# Patient Record
Sex: Female | Born: 1935 | Race: White | Hispanic: No | Smoking: Never smoker
Health system: Southern US, Community
[De-identification: ages and names within clinical notes are randomized; demographics above are authoritative.]

## PROBLEM LIST (undated history)

## (undated) DIAGNOSIS — L853 Xerosis cutis: Secondary | ICD-10-CM

## (undated) DIAGNOSIS — M810 Age-related osteoporosis without current pathological fracture: Secondary | ICD-10-CM

## (undated) DIAGNOSIS — I509 Heart failure, unspecified: Secondary | ICD-10-CM

## (undated) DIAGNOSIS — K5900 Constipation, unspecified: Secondary | ICD-10-CM

## (undated) DIAGNOSIS — E222 Syndrome of inappropriate secretion of antidiuretic hormone: Secondary | ICD-10-CM

## (undated) DIAGNOSIS — I4891 Unspecified atrial fibrillation: Secondary | ICD-10-CM

## (undated) HISTORY — DX: Heart failure, unspecified: I50.9

## (undated) HISTORY — DX: Age-related osteoporosis without current pathological fracture: M81.0

## (undated) HISTORY — DX: Constipation, unspecified: K59.00

## (undated) HISTORY — DX: Syndrome of inappropriate secretion of antidiuretic hormone: E22.2

## (undated) HISTORY — DX: Xerosis cutis: L85.3

---

## 2014-11-03 DIAGNOSIS — I10 Essential (primary) hypertension: Secondary | ICD-10-CM | POA: Diagnosis present

## 2019-06-07 ENCOUNTER — Other Ambulatory Visit: Payer: Self-pay

## 2019-06-07 ENCOUNTER — Non-Acute Institutional Stay: Payer: Medicare Other | Admitting: Primary Care

## 2019-06-07 DIAGNOSIS — Z515 Encounter for palliative care: Secondary | ICD-10-CM

## 2019-06-07 NOTE — Progress Notes (Signed)
[facility] Pall[facility] Care Consult Note Telephone: [phone]  Fax: [phone]  TELEHEALTH VISIT STATEMENT Due to the COVID-19 crisis, this visit was done via telemedicine from my office. It was initiated and consented to by this patient and/or family.  PATIENT NAME: Marie Perkins [address] [place] [address] [phone] (home)  DOB: Nov 15, 1926 MRN: [id number]  PRIMARY CARE PROVIDER:   Marisa Hua, MD, [place] [address] [phone]  REFERRING PROVIDER:  Marisa Hua, MD [address] [place],  [place] [address] [phone]  RESPONSIBLE PARTY:   Extended Emergency Contact Information Primary Emergency Contact: Posey, DeWitt Phone: [phone] Relation: Son  ASSESSMENT AND RECOMMENDATIONS:   1. Advance Care Planning/Goals of Care: Goals include to maximize quality of life and symptom management. Son is POA and patient gave permission to phone and discuss visit with him. I called durable POA Lorayne Bender and discussed her care. Son states they have never had an advance directives or living will.  He is durable and not health care power of attorney.There does not appear to be a health care power of attorney but he will address this.[name] would like to do a MOST form now, and I will mail it to him.   2. Symptom Management:   Nutrition: Recommend Oragel for gum pain and dental care for ill fitting dentures. Recommend mechanical soft diet to see if she tolerates it well with mouth pain. Complains about dentures. States sore gums. Needs dentist assessment asap. Would like a mechanical soft until she can get dentures resolved. I discussed dental care with son. They may have used a topical treatment in the past like Oragel. Son thought she may just want to change her diet rather than get new dentures but he will let her decide.  Pain: Recommend scheduled tylenol arthritis bid or  tid, 650 mg. Widespread arthritic pain. States she is not in pain if she sits still.  I discussed pain management with son who states she can ask for norco when she needs. He was able to see on their billing that she has not been taking much since she has not had a refill in 2 months. I would like to start with scheduling tylenol and then assess on next visit if she needs narcotic scheduling. Continue with prn for now.  3. Family /Caregiver/Community Supports:  Son is POA. Patient lives in [place]. Has other children out of the area.Has a disabled son. Husband died recently, and he was her caregiver. She moved to LTC in 2/20.  4. Cognitive / Functional decline: Alert, oriented x 2-3. She is retired Marine scientist. State she can walk with stand by assist and walker. She has call bell and demonstrated its use.  5. Follow up Palliative Care Visit: Palliative care will continue to follow for goals of care clarification and symptom management. Return 5-6 weeks or prn.  I spent 60 minutes providing this consultation,  from 0930 to 1030. More than 50% of the time in this consultation was spent coordinating communication.   HISTORY OF PRESENT ILLNESS:  Marie Perkins is a 85 y.o. year old female with multiple medical problems including arthritis, immobility, chronic pain,h/o CHF, HTN, constipation, spinal stenosis.  Palliative Care was asked to follow this patient by consultation request of Marisa Hua, MD to help address advance care planning and goals of care. This is the initial visit. CODE STATUS: FULL CODE  PPS: 30%  HOSPICE ELIGIBILITY/DIAGNOSIS:  no  PAST MEDICAL HISTORY: CHF, Pain, HTN, OA, constipation, HLD, spinal stenosis, osteoporosis, SIADH SOCIAL HX:  Social History   Tobacco Use  . Smoking status: Not on file  Substance Use Topics  . Alcohol use: Not on file    ALLERGIES:  Allergies  Allergen Reactions  . Crestor [Rosuvastatin Calcium]   . Pravastatin   . Simvastatin     PERTINENT  MEDICATIONS:  Outpatient Encounter Medications as of 06/07/2019  Medication Sig  . acetaminophen (TYLENOL) 650 MG CR tablet Take 650 mg by mouth at bedtime. PRN pain  . benazepril (LOTENSIN) 20 MG tablet Take 20 mg by mouth 2 (two) times daily.  . bisacodyl (DULCOLAX) 5 MG EC tablet Take 5 mg by mouth daily.  . calcium carbonate (OSCAL) 1500 (600 Ca) MG TABS tablet Take by mouth daily. Once a day on MWF  . carvedilol (COREG) 3.125 MG tablet Take 3.125 mg by mouth every 12 (twelve) hours.  . diclofenac Sodium (VOLTAREN) 1 % GEL Apply 2 g topically 4 (four) times daily as needed. As needed to bilateral knees for pain  . furosemide (LASIX) 40 MG tablet Take 40 mg by mouth daily.  . Garlic 10 MG CAPS Take 1 tablet by mouth daily. On Sunday, Tues, Thurs and Saturday.  . Grape Seed 50 MG CAPS Take 1 capsule by mouth daily. On Sunday, Tues, Thurs and Sat.  Marland Kitchen HYDROcodone-acetaminophen (NORCO/VICODIN) 5-325 MG tablet Take 0.5 tablets by mouth every 6 (six) hours as needed. Mild pain  . HYDROcodone-acetaminophen (NORCO/VICODIN) 5-325 MG tablet Take 1 tablet by mouth every 6 (six) hours as needed for moderate pain.  . Lidocaine (ASPERCREME LIDOCAINE) 4 % PTCH Apply 1 patch topically at bedtime. Apply in the pm and remove in the am.  . magnesium hydroxide (MILK OF MAGNESIA) 400 MG/5ML suspension Take 30 mLs by mouth daily as needed for mild constipation.  . Omega-3 Fatty Acids (EQL OMEGA 3 FISH OIL) 1000 MG CAPS Take 1 capsule by mouth every other day.  . polyethylene glycol (MIRALAX / GLYCOLAX) 17 g packet Take 17 g by mouth daily. On Monday, Wednesday and Friday.  . Turmeric (QC TUMERIC COMPLEX) 500 MG CAPS Take 1 capsule by mouth daily.  . Vitamin D, Ergocalciferol, (DRISDOL) 1.25 MG (50000 UT) CAPS capsule Take 50,000 Units by mouth every 14 (fourteen) days. On Tuesday.   No facility-administered encounter medications on file as of 06/07/2019.      PHYSICAL EXAM / ROS:    97.5  68-6 118/62 Current  and past weights: 120-122 lbs. Son states 1 year ago was 130 lbs General: NAD, frail appearing, thin, Endorses general hip pain  HEENT: Endorses oral pain and mal-fitting dentures. Cardiovascular: no chest pain reported, no edema noted Pulmonary: no cough, no increased SOB, no DOE with talking, Room air, oxygen 95% Abdomen: appetite good, denies constipation, h/o constipationendorses loose BM last pm incontinent of bowel GU: denies dysuria, incontinent of urine MSK:  no joint deformities, ambulatory with walker and stand by assistance Skin: no rashes or wounds reported Neurological: Weakness, forgetful, states sleeps well.   Eliezer Lofts, NP [facility]

## 2019-07-11 ENCOUNTER — Other Ambulatory Visit: Payer: Self-pay

## 2019-07-11 ENCOUNTER — Non-Acute Institutional Stay: Payer: Medicare Other | Admitting: Primary Care

## 2019-07-12 ENCOUNTER — Other Ambulatory Visit: Payer: Self-pay

## 2019-07-12 ENCOUNTER — Non-Acute Institutional Stay: Payer: Medicare Other | Admitting: Primary Care

## 2019-07-12 DIAGNOSIS — Z515 Encounter for palliative care: Secondary | ICD-10-CM

## 2019-07-12 NOTE — Progress Notes (Signed)
[facility] Pall[facility] Consult Note Telephone: [phone]  Fax: [phone]  TELEHEALTH VISIT STATEMENT Due to the COVID-19 crisis, this visit was done via telemedicine from my office. It was initiated and consented to by this patient and/or family.  PATIENT NAME: Marie Perkins [address] [place] [address] [phone] (home)  DOB: [birth year] MRN: [id number]  PRIMARY CARE PROVIDER:   Marisa Hua, MD, [place] [address] [phone]  REFERRING PROVIDER:  Marisa Hua, MD [address] [place],  [place] [address] [phone]  RESPONSIBLE PARTY:   Extended Emergency Contact Information Primary Emergency Contact: Marie, Perkins Address: [place]          [place],  [address] Home Phone: [phone] Relation: Son   ASSESSMENT AND RECOMMENDATIONS:   1. Advance Care Planning/Goals of Care: Goals include to maximize quality of life and symptom management.FUll code per SNF. No MOST returned from son as of yet, will f/u with phone conference  2. Symptom Management:   Nutrition: Eating 75% of meals. Weight improving, has gained  A few pounds in past month.Appears to be tolerating diet well.  Pain:  Recommend changing pain medication to Roxanol 5 mg for mild pain, 10 mg for moderate pain every 4 hours prn, and increasing ATC acetaminophen to q 8 hrs. Continues to endorse back pain but appears to be more comfortable with ATC acetaminophen.  Mobility: This may improve with ATC pain control. States she cannot get oob alone but does walk with walker and stand by assistance.   3. Family /Caregiver/Community Supports:  Son is POA. Patient lives in [place]. Has other children out of the area.Has a disabled son. Husband died recently, and he was her caregiver. She moved to LTC in 2/20.  4. Cognitive / Functional decline: Alert, oriented x 2-3. Can ambulate to bathroom with walker and assistance.  Reports some incontinence if she is not able to get to bathroom in time. She states she cannot get out of her chair by herself.  5. Follow up Palliative Care Visit: Palliative care will continue to follow for goals of care clarification and symptom management. Return 4 weeks or prn.  I spent 25 minutes providing this consultation,  from 1400 to 1425. More than 50% of the time in this consultation was spent coordinating communication.   HISTORY OF PRESENT ILLNESS:  Marie Perkins is a 86 y.o. year old female with multiple medical problems including arthritis, immobility, chronic pain,h/o CHF, HTN, constipation, spinal stenosis. . Palliative Care was asked to follow this patient by consultation request of Marisa Hua, MD to help address advance care planning and goals of care. This is a follow up visit.  CODE STATUS: FULL CODE  PPS: 30% HOSPICE ELIGIBILITY/DIAGNOSIS: TBD  PAST MEDICAL HISTORY: CHF, Pain, HTN, OA, constipation, HLD, spinal stenosis, osteoporosis, SIADH  SOCIAL HX:  Social History   Tobacco Use  . Smoking status: Not on file  Substance Use Topics  . Alcohol use: Not on file    ALLERGIES:  Allergies  Allergen Reactions  . Crestor [Rosuvastatin Calcium]   . Pravastatin   . Simvastatin      PERTINENT MEDICATIONS:  Outpatient Encounter Medications as of 07/12/2019  Medication Sig  . acetaminophen (TYLENOL) 650 MG CR tablet Take 650 mg by mouth at bedtime. PRN pain  . benazepril (LOTENSIN) 20 MG tablet Take 20 mg by mouth 2 (two) times daily.  . bisacodyl (DULCOLAX) 5  MG EC tablet Take 5 mg by mouth daily.  . calcium carbonate (OSCAL) 1500 (600 Ca) MG TABS tablet Take by mouth daily. Once a day on MWF  . carvedilol (COREG) 3.125 MG tablet Take 3.125 mg by mouth every 12 (twelve) hours.  . diclofenac Sodium (VOLTAREN) 1 % GEL Apply 2 g topically 4 (four) times daily as needed. As needed to bilateral knees for pain  . furosemide (LASIX) 40 MG tablet Take 40 mg by mouth  daily.  . Garlic 10 MG CAPS Take 1 tablet by mouth daily. On Sunday, Tues, Thurs and Saturday.  . Grape Seed 50 MG CAPS Take 1 capsule by mouth daily. On Sunday, Tues, Thurs and Sat.  Marland Kitchen HYDROcodone-acetaminophen (NORCO/VICODIN) 5-325 MG tablet Take 0.5 tablets by mouth every 6 (six) hours as needed. Mild pain  . HYDROcodone-acetaminophen (NORCO/VICODIN) 5-325 MG tablet Take 1 tablet by mouth every 6 (six) hours as needed for moderate pain.  . Lidocaine (ASPERCREME LIDOCAINE) 4 % PTCH Apply 1 patch topically at bedtime. Apply in the pm and remove in the am.  . magnesium hydroxide (MILK OF MAGNESIA) 400 MG/5ML suspension Take 30 mLs by mouth daily as needed for mild constipation.  . Omega-3 Fatty Acids (EQL OMEGA 3 FISH OIL) 1000 MG CAPS Take 1 capsule by mouth every other day.  . polyethylene glycol (MIRALAX / GLYCOLAX) 17 g packet Take 17 g by mouth daily. On Monday, Wednesday and Friday.  . Turmeric (QC TUMERIC COMPLEX) 500 MG CAPS Take 1 capsule by mouth daily.  . Vitamin D, Ergocalciferol, (DRISDOL) 1.25 MG (50000 UT) CAPS capsule Take 50,000 Units by mouth every 14 (fourteen) days. On Tuesday.   No facility-administered encounter medications on file as of 07/12/2019.    PHYSICAL EXAM / ROS:   Current and past weights: 124 lbs. Appears to be gaining some weight General: NAD, frail appearing, thin, endorse back pain. Cardiovascular: no chest pain reported, no edema Pulmonary: no cough, no increased SOB, room air Abdomen: appetite good , denies constipation, continent of bowel GU: denies dysuria, incontinent of urine if not toileted in time. MSK:  no joint deformities, ambulatory with walker and assistance to prevent falls Skin: no rashes or wounds reported Neurological: Weakness, FAST score 6d, sleeps well  Eliezer Lofts, NP [facility]

## 2019-08-08 ENCOUNTER — Non-Acute Institutional Stay: Payer: Medicare Other | Admitting: Primary Care

## 2019-08-09 ENCOUNTER — Other Ambulatory Visit: Payer: Self-pay

## 2019-08-09 ENCOUNTER — Non-Acute Institutional Stay: Payer: Medicare Other | Admitting: Primary Care

## 2019-08-09 DIAGNOSIS — Z515 Encounter for palliative care: Secondary | ICD-10-CM

## 2019-08-09 NOTE — Progress Notes (Signed)
[facility] Pall[facility] Care Consult Note Telephone: [phone]  Fax: [phone]  TELEHEALTH VISIT STATEMENT Due to the COVID-19 crisis, this visit was done via telemedicine from my office. It was initiated and consented to by this patient and/or family.  PATIENT NAME: Marie Perkins [address] [place] [address] [phone] (home)  DOB: Nov 30, 1926 MRN: [id number]  PRIMARY CARE PROVIDER:   Marisa Hua, MD, [place] [address] [phone]  REFERRING PROVIDER:  Marisa Hua, MD [address] [place],  [place] [address] [phone]  RESPONSIBLE PARTY:   Extended Emergency Contact Information Primary Emergency Contact: Margurite, Duffy Address: [place]          [place],  [address] Home Phone: [phone] Relation: Son  ASSESSMENT AND RECOMMENDATIONS:   1. Advance Care Planning/Goals of Care: Goals include to maximize quality of life and symptom management. POA is son [name]. I called his number to inform of visit but he did not pick up and mailbox was full. Not able to leave message. No immediate concerns.  Patient remains FULL code. I mailed a MOST form to him but it has not been returned there.   2. Symptom Management:   Pain: Recommend scheduling acetaminophen 650 mg q 8 hrs. May need to separate narcotic from acetaminophen preparation to limit too much acetaminophen dosing. Needs more medicine lately, having more pain. Recommend scheduling acetaminophen, assessing for more regular narcotic administration  Constipation: Increased now due to increased pain medications. Please increase Miralax to daily.  Nutrition:  States she's eating well, 75% intake.  Mobility: Had PT this week and helping with walking.  Ear pain/HOH: Needs cerumen removal every 2 months. She states she was ordered by Duaine Dredge MD for this and it has not been done for a years. Recommend NP please remove cerumen  every several months for comfort and for better hearing.   Continence; Has voiced several times she is incontinent because she cannot get to the bathroom in time. I suggested she attempt timed bathroom trips, going before she feels she cannot control her bowels or bladder. Staff could assist with a toileting schedule.  3. Family /Caregiver/Community Supports:  Son lives in [place], cares for special needs brother. Pt lives in [place].  4. Cognitive / Functional decline: Alert, oriented x 3. Needs assistance with most adls, all iadls.  5. Follow up Palliative Care Visit: Palliative care will continue to follow for goals of care clarification and symptom management. Return 4-6 weeks or prn.  I spent 25 minutes providing this consultation,  from 1030 to 1055. More than 50% of the time in this consultation was spent coordinating communication.   HISTORY OF PRESENT ILLNESS:  Marie Perkins is a 86 y.o. year old female with multiple medical problems including arthritis, immobility, chronic pain,h/o CHF, HTN, constipation, spinal stenosis. Palliative Care was asked to follow this patient by consultation request of Marisa Hua, MD to help address advance care planning and goals of care. This is a follow up visit.  CODE STATUS:  FULL CODE  PPS: 30% HOSPICE ELIGIBILITY/DIAGNOSIS: TBD  PAST MEDICAL HISTORY: CHF, Pain, HTN, OA, constipation, HLD, spinal stenosis, osteoporosis, SIADH   SOCIAL HX:  Social History   Tobacco Use  . Smoking status: Not on file  Substance Use Topics  . Alcohol use: Not on file    ALLERGIES:  Allergies  Allergen Reactions  . Crestor [Rosuvastatin Calcium]   . Pravastatin   .  Simvastatin      PERTINENT MEDICATIONS:  Outpatient Encounter Medications as of 08/09/2019  Medication Sig  . acetaminophen (TYLENOL) 650 MG CR tablet Take 650 mg by mouth at bedtime. PRN pain  . benazepril (LOTENSIN) 20 MG tablet Take 20 mg by mouth 2 (two) times daily.  . bisacodyl  (DULCOLAX) 5 MG EC tablet Take 5 mg by mouth daily.  . calcium carbonate (OSCAL) 1500 (600 Ca) MG TABS tablet Take by mouth daily. Once a day on MWF  . carvedilol (COREG) 3.125 MG tablet Take 3.125 mg by mouth every 12 (twelve) hours.  . diclofenac Sodium (VOLTAREN) 1 % GEL Apply 2 g topically 4 (four) times daily as needed. As needed to bilateral knees for pain  . furosemide (LASIX) 40 MG tablet Take 40 mg by mouth daily.  . Garlic 10 MG CAPS Take 1 tablet by mouth daily. On Sunday, Tues, Thurs and Saturday.  . Grape Seed 50 MG CAPS Take 1 capsule by mouth daily. On Sunday, Tues, Thurs and Sat.  Marland Kitchen HYDROcodone-acetaminophen (NORCO/VICODIN) 5-325 MG tablet Take 0.5 tablets by mouth every 6 (six) hours as needed. Mild pain  . HYDROcodone-acetaminophen (NORCO/VICODIN) 5-325 MG tablet Take 1 tablet by mouth every 6 (six) hours as needed for moderate pain.  . Lidocaine (ASPERCREME LIDOCAINE) 4 % PTCH Apply 1 patch topically at bedtime. Apply in the pm and remove in the am.  . magnesium hydroxide (MILK OF MAGNESIA) 400 MG/5ML suspension Take 30 mLs by mouth daily as needed for mild constipation.  . Omega-3 Fatty Acids (EQL OMEGA 3 FISH OIL) 1000 MG CAPS Take 1 capsule by mouth every other day.  . polyethylene glycol (MIRALAX / GLYCOLAX) 17 g packet Take 17 g by mouth daily. On Monday, Wednesday and Friday.  . Turmeric (QC TUMERIC COMPLEX) 500 MG CAPS Take 1 capsule by mouth daily.  . Vitamin D, Ergocalciferol, (DRISDOL) 1.25 MG (50000 UT) CAPS capsule Take 50,000 Units by mouth every 14 (fourteen) days. On Tuesday.   No facility-administered encounter medications on file as of 08/09/2019.    PHYSICAL EXAM / ROS:   Current and past weights: 122.6  lbs General: NAD, frail appearing, thin, ENDORSES pain in back and legs Cardiovascular: no chest pain reported, no edema Pulmonary: no cough, no increased SOB, room air Abdomen: appetite good, endorses constipation, incontinent of bowel if not taken to the  bathroom in time. GU: denies dysuria, incontinent of urine if not taken to bathroom in time. MSK:  no joint deformities,non ambulatory Skin: no rashes or wounds reported Neurological: Weakness, endorses pain, poor sleep  Eliezer Lofts, NP [facility]

## 2019-09-05 ENCOUNTER — Other Ambulatory Visit: Payer: Self-pay

## 2019-09-05 ENCOUNTER — Non-Acute Institutional Stay: Payer: Medicare Other | Admitting: Primary Care

## 2019-09-05 DIAGNOSIS — Z515 Encounter for palliative care: Secondary | ICD-10-CM

## 2019-09-05 NOTE — Progress Notes (Signed)
[facility] Consult Note Telephone: [phone]  Fax: [phone]   PATIENT NAME: Marie Perkins [address] [place] [address] [phone] (home)  DOB: 1926/12/22 MRN: [id number]  PRIMARY CARE PROVIDER:   Marisa Hua, MD, [place] [address] [phone]  REFERRING PROVIDER:  Marisa Hua, MD [address] [place],  [place] [address] [phone]  RESPONSIBLE PARTY:   Extended Emergency Contact Information Primary Emergency Contact: Marie Perkins Address: [address]          [place], [place] [address] Home Phone: [phone] Relation: Son   ASSESSMENT AND RECOMMENDATIONS:   1. Advance Care Planning/Goals of Care: Goals include to maximize quality of life and symptom management.  I met with [name] today in her room. She was alert and endorses some forgetfulness. She is pleasant and denies severe pain. She is comfortable sitting but is painful when she gets up to ambulate.   2. Symptom Management:   Hearing deficit: Has reported in the past. Please order ear cleaning every 2 months. Please order debrox or oil to soften ear wax. Please order audiology f/u. She has not been seen in a year. She goes to [facility]; she is also in need of an eye exam as well.  Pain: Please schedule dicloflonac qid  ATC and acetaminophen 650 mg q 8 hours ATC.  She continues to complain of unresolved pain  Scalp scaling: Patient is concerned about scalp flaking. Please get selsun blue or similar for scalp  Timed toileting: Please take to bathroom on a schedule. She states often she cannot wait when she puts on the call bell.  Has taken Covid 19 immunization.   3. Family /Caregiver/Community Supports: Son is in [place] and is POA. Living in LTC.  4. Cognitive / Functional decline: Alert oriented x 2-3. States some forgetfulness. Needs help / stand by with ambulation, other adls.   5. Follow  up Palliative Care Visit: Palliative care will continue to follow for goals of care clarification and symptom management. Return 2 weeks or prn.  I spent 25 minutes providing this consultation,  from 1200 to 1225. More than 50% of the time in this consultation was spent coordinating communication.   HISTORY OF PRESENT ILLNESS:  Marie Perkins is a 86 y.o. year old female with multiple medical problems including arthritis, immobility, chronic pain,h/o CHF, HTN, constipation, spinal stenosis. Palliative Care was asked to follow this patient by consultation request of Marisa Hua, MD to help address advance care planning and goals of care. This is a follow up visit.  CODE STATUS: FULL CODE PPS: 40% HOSPICE ELIGIBILITY/DIAGNOSIS: TBD  PAST MEDICAL HISTORY: CHF, Pain, HTN, OA, constipation, HLD, spinal stenosis, osteoporosis, SIADH SOCIAL HX:  Social History   Tobacco Use  . Smoking status: Not on file  Substance Use Topics  . Alcohol use: Not on file    ALLERGIES:  Allergies  Allergen Reactions  . Crestor [Rosuvastatin Calcium]   . Pravastatin   . Simvastatin      PERTINENT MEDICATIONS:  Outpatient Encounter Medications as of 09/05/2019  Medication Sig  . acetaminophen (TYLENOL) 650 MG CR tablet Take 650 mg by mouth at bedtime. PRN pain  . benazepril (LOTENSIN) 20 MG tablet Take 20 mg by mouth 2 (two) times daily.  . bisacodyl (DULCOLAX) 5 MG EC tablet Take 5 mg by mouth daily.  . calcium carbonate (OSCAL) 1500 (600 Ca) MG  TABS tablet Take by mouth daily. Once a day on MWF  . carvedilol (COREG) 3.125 MG tablet Take 3.125 mg by mouth every 12 (twelve) hours.  . diclofenac Sodium (VOLTAREN) 1 % GEL Apply 2 g topically 4 (four) times daily as needed. As needed to bilateral knees for pain  . furosemide (LASIX) 40 MG tablet Take 40 mg by mouth daily.  . Garlic 10 MG CAPS Take 1 tablet by mouth daily. On Sunday, Tues, Thurs and Saturday.  . Grape Seed 50 MG CAPS Take 1 capsule by mouth  daily. On Sunday, Tues, Thurs and Sat.  Marland Kitchen HYDROcodone-acetaminophen (NORCO/VICODIN) 5-325 MG tablet Take 0.5 tablets by mouth every 6 (six) hours as needed. Mild pain  . HYDROcodone-acetaminophen (NORCO/VICODIN) 5-325 MG tablet Take 1 tablet by mouth every 6 (six) hours as needed for moderate pain.  . Lidocaine (ASPERCREME LIDOCAINE) 4 % PTCH Apply 1 patch topically at bedtime. Apply in the pm and remove in the am.  . magnesium hydroxide (MILK OF MAGNESIA) 400 MG/5ML suspension Take 30 mLs by mouth daily as needed for mild constipation.  . Omega-3 Fatty Acids (EQL OMEGA 3 FISH OIL) 1000 MG CAPS Take 1 capsule by mouth every other day.  . polyethylene glycol (MIRALAX / GLYCOLAX) 17 g packet Take 17 g by mouth daily. On Monday, Wednesday and Friday.  . Turmeric (QC TUMERIC COMPLEX) 500 MG CAPS Take 1 capsule by mouth daily.  . Vitamin D, Ergocalciferol, (DRISDOL) 1.25 MG (50000 UT) CAPS capsule Take 50,000 Units by mouth every 14 (fourteen) days. On Tuesday.   No facility-administered encounter medications on file as of 09/05/2019.    PHYSICAL EXAM / ROS:   Current and past weights: 124 lbs General: NAD, frail appearing, thin,  HeeNT: endorses ear discomfort , endorses scalp scaling Cardiovascular: no chest pain reported, no LE edema Pulmonary: no cough, no increased SOB, room air Abdomen: appetite good, denies constipation, continent of bowel GU: denies dysuria, incontinent of urine MSK:  no joint deformities, ambulatory with  Walker with stand by assistance Skin: no rashes or wounds reported Neurological: Weakness, memory loss  Jason Coop, NP  [facility]   COVID-19 PATIENT SCREENING TOOL  Person answering questions: __________Staff_________ _____   1.  Is the patient or any family member in the home showing any signs or symptoms regarding respiratory infection?               Person with Symptom- ___________________________  a. Fever                                                                           Yes___ No___          ___________________  b. Shortness of breath                                                    Yes___ No___          ___________________ c. Cough/congestion  Yes___  No___         ___________________ d. Body aches/pains                                                         Yes___ No___        ____________________ e. Gastrointestinal symptoms (diarrhea, nausea)           Yes___ No___        ____________________  2. Within the past 14 days, has anyone living in the home had any contact with someone with or under investigation for COVID-19?    Yes___ No_x_   Person __________________

## 2019-09-20 ENCOUNTER — Non-Acute Institutional Stay: Payer: Medicare Other | Admitting: Primary Care

## 2019-09-20 ENCOUNTER — Other Ambulatory Visit: Payer: Self-pay

## 2019-09-20 DIAGNOSIS — Z515 Encounter for palliative care: Secondary | ICD-10-CM

## 2019-09-20 NOTE — Progress Notes (Signed)
[facility] Consult Note Telephone: [phone]  Fax: [phone]    PATIENT NAME: Marie Perkins [address] [place] [address] [phone] (home)  DOB: Feb 22, 1927 MRN: [id number]  PRIMARY CARE PROVIDER:   Marisa Hua, MD, [place] [address] [phone]  REFERRING PROVIDER:  Marisa Hua, MD [address] [place],  [place] [address] [phone]  RESPONSIBLE PARTY:   Extended Emergency Contact Information Primary Emergency Contact: Agueda, Houpt Address: [address]          [place], [place] [address] Home Phone: [phone] Relation: Son  I met with Mrs. Mongeau in her room.  ASSESSMENT AND RECOMMENDATIONS:   1. Advance Care Planning/Goals of Care: Goals include to maximize quality of life and symptom management. Patient has a full code directive. I will ask her on her next visit about these directives. She does not have a Living will on file and a most form mailed to her son has not been returned. Once I have a discussion with her I will follow up with any directive she would like to make.  2. Symptom Management:   Constipation patient endorsed ongoing constipation. She states she goes every day or every other day but has a hard stool that requires straining. Currently her MiraLAX was listed as three times a week. I recommended and left an order to increase that to daily. She's also on some other preparation. The goal would be to titrate to a formed soft stool every other day. I will continue to monitor.   Intake. Patient is eating for 75 to 100% of meals. Her weight is maintaining stable. She has some snacks in her room and endorses a good appetite.   Impacted cerumen This has been a reported issue and she was seen every 2 month for this before covid. Today I brought my otoscope and examined her ears and attempted irrigation. Her right ear has partial obstruction of cerumen but  tympanum can be visualized. The left however has completely impacted cerumen. Nothing irrigated so I left an order for Debrox in both ears. This will be given times five days and I will return on Wednesday to disimpact the cerumen.  3. Family /Caregiver/Community Supports: POA Son lives in [place], has other children in area. Current resident of LTC.  4. Cognitive / Functional decline: Alert and oriented x 3, forgetful. Able to ambulate to bathroom with help. Needs assistance with most adls, all iadls.   5. Follow up Palliative Care Visit: Palliative care will continue to follow for goals of care clarification and symptom management. Return 1 week or prn.  I spent 35 minutes providing this consultation,  from 1100 to 1135. More than 50% of the time in this consultation was spent coordinating communication.   HISTORY OF PRESENT ILLNESS:  Marie Perkins is a 86 y.o. year old female with multiple medical problems including Hearing loss, impacted cerumen, constipation, cognitive impairment. Palliative Care was asked to follow this patient by consultation request of Marisa Hua, MD to help address advance care planning and goals of care. This is a follow up visit.  CODE STATUS: FULL CODE  PPS: 30% HOSPICE ELIGIBILITY/DIAGNOSIS: TBD  PAST MEDICAL HISTORY: No past medical history on file.  SOCIAL HX:  Social History   Tobacco Use  . Smoking status: Not on file  Substance Use Topics  . Alcohol use: Not on file    ALLERGIES:  Allergies  Allergen Reactions  . Crestor [Rosuvastatin Calcium]   . Pravastatin   . Simvastatin      PERTINENT MEDICATIONS:  Outpatient Encounter Medications as of 09/20/2019  Medication Sig  . acetaminophen (TYLENOL) 650 MG CR tablet Take 650 mg by mouth at bedtime. PRN pain  . benazepril (LOTENSIN) 20 MG tablet Take 20 mg by mouth 2 (two) times daily.  . bisacodyl (DULCOLAX) 5 MG EC tablet Take 5 mg by mouth daily.  . calcium carbonate (OSCAL) 1500 (600 Ca) MG  TABS tablet Take by mouth daily. Once a day on MWF  . carvedilol (COREG) 3.125 MG tablet Take 3.125 mg by mouth every 12 (twelve) hours.  . diclofenac Sodium (VOLTAREN) 1 % GEL Apply 2 g topically 4 (four) times daily as needed. As needed to bilateral knees for pain  . furosemide (LASIX) 40 MG tablet Take 40 mg by mouth daily.  . Garlic 10 MG CAPS Take 1 tablet by mouth daily. On Sunday, Tues, Thurs and Saturday.  . Grape Seed 50 MG CAPS Take 1 capsule by mouth daily. On Sunday, Tues, Thurs and Sat.  Marland Kitchen HYDROcodone-acetaminophen (NORCO/VICODIN) 5-325 MG tablet Take 0.5 tablets by mouth every 6 (six) hours as needed. Mild pain  . HYDROcodone-acetaminophen (NORCO/VICODIN) 5-325 MG tablet Take 1 tablet by mouth every 6 (six) hours as needed for moderate pain.  . Lidocaine (ASPERCREME LIDOCAINE) 4 % PTCH Apply 1 patch topically at bedtime. Apply in the pm and remove in the am.  . magnesium hydroxide (MILK OF MAGNESIA) 400 MG/5ML suspension Take 30 mLs by mouth daily as needed for mild constipation.  . Omega-3 Fatty Acids (EQL OMEGA 3 FISH OIL) 1000 MG CAPS Take 1 capsule by mouth every other day.  . polyethylene glycol (MIRALAX / GLYCOLAX) 17 g packet Take 17 g by mouth daily. On Monday, Wednesday and Friday.  . Turmeric (QC TUMERIC COMPLEX) 500 MG CAPS Take 1 capsule by mouth daily.  . Vitamin D, Ergocalciferol, (DRISDOL) 1.25 MG (50000 UT) CAPS capsule Take 50,000 Units by mouth every 14 (fourteen) days. On Tuesday.   No facility-administered encounter medications on file as of 09/20/2019.    PHYSICAL EXAM / ROS:   Current and past weights: 128 lbs General: NAD, frail appearing, thin HEENT: Hearing loss, impacted cerumen bil  Cardiovascular: no chest pain reported, no edema, S1S2 Pulmonary: no cough, no increased SOB, lungs CTA Abdomen: appetite good, endorses constipation, continent of bowel GU: denies dysuria, continent of urine MSK:  no joint deformities, ambulatory with walker and  assistance Skin: no rashes or wounds reported Neurological: Weakness, denies pain, HOH, cognitive impairment  Jason Coop, NP [facility]  COVID-19 PATIENT SCREENING TOOL  Person answering questions: ____________Staff_______ _____   1.  Is the patient or any family member in the home showing any signs or symptoms regarding respiratory infection?               Person with Symptom- __________NA_________________  a. Fever                                                                          Yes___ No___          ___________________  b. Shortness of breath  Yes___ No___          ___________________ c. Cough/congestion                                       Yes___  No___         ___________________ d. Body aches/pains                                                         Yes___ No___        ____________________ e. Gastrointestinal symptoms (diarrhea, nausea)           Yes___ No___        ____________________  2. Within the past 14 days, has anyone living in the home had any contact with someone with or under investigation for COVID-19?    Yes___ No_X_   Person __________________

## 2019-09-25 ENCOUNTER — Non-Acute Institutional Stay: Payer: Medicare Other | Admitting: Primary Care

## 2019-09-25 ENCOUNTER — Other Ambulatory Visit: Payer: Self-pay

## 2019-09-26 ENCOUNTER — Non-Acute Institutional Stay: Payer: Medicare Other | Admitting: Primary Care

## 2019-09-26 ENCOUNTER — Other Ambulatory Visit: Payer: Self-pay

## 2019-09-26 DIAGNOSIS — Z515 Encounter for palliative care: Secondary | ICD-10-CM

## 2019-09-26 NOTE — Progress Notes (Signed)
Therapist, [facility] Palliative Care Consult Note Telephone: [phone]  Fax: [phone]    PATIENT NAME: Marie Perkins [address] [facility] [place] [address] [phone] (home)  DOB: 02-21-1927 MRN: [id number]  PRIMARY CARE PROVIDER:   Drue Flirt, MD, [address] [place] [address] [phone]  REFERRING PROVIDER:  Drue Flirt, MD [address] [place],  [place] [address] [phone]  RESPONSIBLE PARTY:   Extended Emergency Contact Information Primary Emergency Contact: Marie, Perkins Address: [address]          [place], [place] [address] Home Phone: [phone] Relation: Son   ASSESSMENT AND RECOMMENDATIONS:   1. Advance Care Planning/Goals of Care: Goals include to maximize quality of life and symptom management. Call to POA to discuss f/u with audiology and optometry to have routine assessments. Son will call for f/u with Dr. Yetta Perkins and discuss with facility re their policy. RE Goals of care discussed several months ago, POA will f/u with [facility] form return.  2. Symptom Management:   Hearing impairment: Has had softening drops per protocol x 4 days. Some movement of cerumen and I can partially visualize tympanum. She reports some better hearing. There is only partial removal so I will have asked SNF to give drops another 7 days and reexamine.  3. Family /Caregiver/Community Supports: Lives in LTC. Has 4 children, one is POA. We discussed the difficulty of being isolated this year. She however texts which gives her some communication.  4. Cognitive / Functional decline: Alert, oriented x 3  But forgetful at times. Ambulates with walker and assistance.   5. Follow up Palliative Care Visit: Palliative care will continue to follow for goals of care clarification and symptom management. Return 1-2 weeks or prn.  I spent 35 minutes providing this consultation,  from 1500 to 1535. More than 50% of the time in this  consultation was spent coordinating communication.   HISTORY OF PRESENT ILLNESS:  Marie Perkins is a 86 y.o. year old female with multiple medical problems including HOH, chronic pain. Palliative Care was asked to follow this patient by consultation request of Marie Flirt, MD to help address advance care planning and goals of care. This is a follow up visit.  CODE STATUS: FULL  PPS: 40% HOSPICE ELIGIBILITY/DIAGNOSIS: no  PAST MEDICAL HISTORY: CHF, Pain, HTN, OA, constipation, HLD, spinal stenosis, osteoporosis, SIADH  SOCIAL HX:  Social History   Tobacco Use  . Smoking status: Not on file  Substance Use Topics  . Alcohol use: Not on file    ALLERGIES:  Allergies  Allergen Reactions  . Crestor [Rosuvastatin Calcium]   . Pravastatin   . Simvastatin      PERTINENT MEDICATIONS:  Outpatient Encounter Medications as of 09/26/2019  Medication Sig  . acetaminophen (TYLENOL) 650 MG CR tablet Take 650 mg by mouth at bedtime. PRN pain  . benazepril (LOTENSIN) 20 MG tablet Take 20 mg by mouth 2 (two) times daily.  . bisacodyl (DULCOLAX) 5 MG EC tablet Take 5 mg by mouth daily.  . calcium carbonate (OSCAL) 1500 (600 Ca) MG TABS tablet Take by mouth daily. Once a day on MWF  . carvedilol (COREG) 3.125 MG tablet Take 3.125 mg by mouth every 12 (twelve) hours.  . diclofenac Sodium (VOLTAREN) 1 % GEL Apply 2 g topically 4 (four) times daily as needed. As needed to bilateral knees for pain  . furosemide (LASIX) 40 MG tablet Take 40 mg by  mouth daily.  . Garlic 10 MG CAPS Take 1 tablet by mouth daily. On Sunday, Tues, Thurs and Saturday.  . Grape Seed 50 MG CAPS Take 1 capsule by mouth daily. On Sunday, Tues, Thurs and Sat.  Marland Kitchen HYDROcodone-acetaminophen (NORCO/VICODIN) 5-325 MG tablet Take 0.5 tablets by mouth every 6 (six) hours as needed. Mild pain  . HYDROcodone-acetaminophen (NORCO/VICODIN) 5-325 MG tablet Take 1 tablet by mouth every 6 (six) hours as needed for moderate pain.  .  Lidocaine (ASPERCREME LIDOCAINE) 4 % PTCH Apply 1 patch topically at bedtime. Apply in the pm and remove in the am.  . magnesium hydroxide (MILK OF MAGNESIA) 400 MG/5ML suspension Take 30 mLs by mouth daily as needed for mild constipation.  . Omega-3 Fatty Acids (EQL OMEGA 3 FISH OIL) 1000 MG CAPS Take 1 capsule by mouth every other day.  . polyethylene glycol (MIRALAX / GLYCOLAX) 17 g packet Take 17 g by mouth daily. On Monday, Wednesday and Friday.  . Turmeric (QC TUMERIC COMPLEX) 500 MG CAPS Take 1 capsule by mouth daily.  . Vitamin D, Ergocalciferol, (DRISDOL) 1.25 MG (50000 UT) CAPS capsule Take 50,000 Units by mouth every 14 (fourteen) days. On Tuesday.   No facility-administered encounter medications on file as of 09/26/2019.    PHYSICAL EXAM / ROS:   Current and past weights: unavailable, WNWD General: NAD, frail appearing, denies pain currently Cardiovascular: no chest pain reported, no edema Pulmonary: no cough, no increased SOB, room air Abdomen: appetite good, endorses occ constipation, continent of bowel GU: denies dysuria, continent of urine MSK:  no joint deformities, ambulatory with walker Skin: no rashes or wounds reported Neurological: Weakness, a and o, chronic pain  Jason Coop, NP [facility]  COVID-19 PATIENT SCREENING TOOL  Person answering questions: ____________Staff_______ _____   1.  Is the patient or any family member in the home showing any signs or symptoms regarding respiratory infection?               Person with Symptom- __________NA_________________  a. Fever                                                                          Yes___ No___          ___________________  b. Shortness of breath                                                    Yes___ No___          ___________________ c. Cough/congestion                                       Yes___  No___         ___________________ d. Body aches/pains  Yes___ No___        ____________________ e. Gastrointestinal symptoms (diarrhea, nausea)           Yes___ No___        ____________________  2. Within the past 14 days, has anyone living in the home had any contact with someone with or under investigation for COVID-19?    Yes___ No_X_   Person __________________

## 2019-10-18 ENCOUNTER — Non-Acute Institutional Stay: Payer: Medicare Other | Admitting: Primary Care

## 2019-10-18 ENCOUNTER — Other Ambulatory Visit: Payer: Self-pay

## 2019-10-18 DIAGNOSIS — Z515 Encounter for palliative care: Secondary | ICD-10-CM

## 2019-10-18 NOTE — Progress Notes (Signed)
[facility] Consult Note Telephone: [phone]  Fax: [phone]    PATIENT NAME: Marie Perkins [address] [place] [address] [phone] (home)  DOB: 1926-11-27 MRN: [id number]  PRIMARY CARE PROVIDER:   Marisa Hua, MD, [place] [address] [phone]  REFERRING PROVIDER:  Marisa Hua, MD [address] [place],  [place] [address] [phone]  RESPONSIBLE PARTY:   Extended Emergency Contact Information Primary Emergency Contact: Vila, Dory Address: [address]          [place], [place] [address] Home Phone: [phone] Relation: Son   I met with patient in the facility.  ASSESSMENT AND RECOMMENDATIONS:   1. Advance Care Planning/Goals of Care: Goals include to maximize quality of life and symptom management. Discussed decision making proxy. Patient states it is her son [name]. There is a durable POA but no health care, advance directive or MOST on her chart. I left a message for [place] with message I would mail these out and we could speak next week.   2. Symptom Management:   Hard of Hearing: Patient went to ENT and audiology. She had ear cleaning and repair of her hearing aide. Her hearing is greatly improved, allowing her good interaction with caregivers and her environment. Recommend f/u q 3 months per recommendation of ENT.  Pain: Continues to endorse. I would suggest scheduling 650 mg acetaminophen CR q 8 hrs, and also Aleve 220 mg bid, and attempting to wean off narcotic. I would also suggest changing to a non combination narcotic to allow for better titration of acetaminophen.   3. Family /Caregiver/Community Supports: Son is acting POA, no advance directives  Noted on chart or vynca.  4. Cognitive / Functional decline: A and O x 3, forgetful at times. Improved interaction with hearing improvement. Needs help with ambulation, many adls, all iadls.   5.  Follow up Palliative Care Visit: Palliative care will continue to follow for goals of care clarification and symptom management. Return 4-6 weeks or prn.  I spent 25 minutes providing this consultation,  from 1130 to 1155. More than 50% of the time in this consultation was spent coordinating communication.   HISTORY OF PRESENT ILLNESS:  Marie Perkins is a 86 y.o. year old female with multiple medical problems including CHF, Pain, HTN, OA, constipation, HLD, spinal stenosis, osteoporosis, SIADH. Palliative Care was asked to follow this patient by consultation request of Marisa Hua, MD to help address advance care planning and goals of care. This is a follow up visit.  CODE STATUS: FULL  PPS: 40% HOSPICE ELIGIBILITY/DIAGNOSIS: TBD  PAST MEDICAL HISTORY: CHF, Pain, HTN, OA, constipation, HLD, spinal stenosis, osteoporosis, SIADH SOCIAL HX:  Social History   Tobacco Use  . Smoking status: Not on file  Substance Use Topics  . Alcohol use: Not on file    ALLERGIES:  Allergies  Allergen Reactions  . Crestor [Rosuvastatin Calcium]   . Pravastatin   . Simvastatin      PERTINENT MEDICATIONS:  Outpatient Encounter Medications as of 10/18/2019  Medication Sig  . acetaminophen (TYLENOL) 650 MG CR tablet Take 650 mg by mouth at bedtime. PRN pain  . benazepril (LOTENSIN) 20 MG tablet Take 20 mg by mouth 2 (two) times daily.  . bisacodyl (DULCOLAX) 5 MG EC tablet Take 5 mg by mouth daily.  . calcium carbonate (OSCAL) 1500 (600 Ca) MG TABS tablet Take by mouth daily. Once a day  on MWF  . carvedilol (COREG) 3.125 MG tablet Take 3.125 mg by mouth every 12 (twelve) hours.  . diclofenac Sodium (VOLTAREN) 1 % GEL Apply 2 g topically 4 (four) times daily as needed. As needed to bilateral knees for pain  . furosemide (LASIX) 40 MG tablet Take 40 mg by mouth daily.  . Garlic 10 MG CAPS Take 1 tablet by mouth daily. On Sunday, Tues, Thurs and Saturday.  . Grape Seed 50 MG CAPS Take 1 capsule by  mouth daily. On Sunday, Tues, Thurs and Sat.  Marland Kitchen HYDROcodone-acetaminophen (NORCO/VICODIN) 5-325 MG tablet Take 0.5 tablets by mouth every 6 (six) hours as needed. Mild pain  . HYDROcodone-acetaminophen (NORCO/VICODIN) 5-325 MG tablet Take 1 tablet by mouth every 6 (six) hours as needed for moderate pain.  . Lidocaine (ASPERCREME LIDOCAINE) 4 % PTCH Apply 1 patch topically at bedtime. Apply in the pm and remove in the am.  . magnesium hydroxide (MILK OF MAGNESIA) 400 MG/5ML suspension Take 30 mLs by mouth daily as needed for mild constipation.  . Omega-3 Fatty Acids (EQL OMEGA 3 FISH OIL) 1000 MG CAPS Take 1 capsule by mouth every other day.  . polyethylene glycol (MIRALAX / GLYCOLAX) 17 g packet Take 17 g by mouth daily. On Monday, Wednesday and Friday.  . Turmeric (QC TUMERIC COMPLEX) 500 MG CAPS Take 1 capsule by mouth daily.  . Vitamin D, Ergocalciferol, (DRISDOL) 1.25 MG (50000 UT) CAPS capsule Take 50,000 Units by mouth every 14 (fourteen) days. On Tuesday.   No facility-administered encounter medications on file as of 10/18/2019.    PHYSICAL EXAM / ROS:   Current and past weights: 130 lbs, 4 lb gain General: NAD, frail appearing, WNWD Cardiovascular: no chest pain reported, no edema Pulmonary: no cough, no increased SOB, room air Abdomen: appetite good, endorses occ constipation, continent of bowel GU: denies dysuria, incontinent of urine MSK:  no joint deformities, ambulatory with walker Skin: no rashes or wounds reported Neurological: Weakness, a and o x 3, forgetful at times, hearing loss bil  Jason Coop, NP [facility]  COVID-19 PATIENT SCREENING TOOL  Person answering questions: ____________Staff_______ _____   1.  Is the patient or any family member in the home showing any signs or symptoms regarding respiratory infection?               Person with Symptom- __________NA_________________  a. Fever                                                                           Yes___ No___          ___________________  b. Shortness of breath                                                    Yes___ No___          ___________________ c. Cough/congestion  Yes___  No___         ___________________ d. Body aches/pains                                                         Yes___ No___        ____________________ e. Gastrointestinal symptoms (diarrhea, nausea)           Yes___ No___        ____________________  2. Within the past 14 days, has anyone living in the home had any contact with someone with or under investigation for COVID-19?    Yes___ No_X_   Person __________________   

## 2019-11-29 ENCOUNTER — Non-Acute Institutional Stay: Payer: Medicare Other | Admitting: Primary Care

## 2019-11-29 ENCOUNTER — Other Ambulatory Visit: Payer: Self-pay

## 2019-11-29 DIAGNOSIS — Z515 Encounter for palliative care: Secondary | ICD-10-CM

## 2019-11-29 NOTE — Progress Notes (Signed)
[facility] Consult Note Telephone: [phone]  Fax: [phone]  PATIENT NAME: Marie Perkins [address] [place] [address] [phone] (home)  DOB: 03-08-1927 MRN: [id number]  PRIMARY CARE PROVIDER:    Marisa Hua, MD,  [place] [address] [phone]  REFERRING PROVIDER:   Marisa Hua, MD [address] [place],  [place] [address] [phone]  RESPONSIBLE PARTY:   Extended Emergency Contact Information Primary Emergency Contact: Marie, Perkins Address: [address]          [place], [place] [address] Home Phone: [phone] Relation: Son    I met with patient in facility.  ASSESSMENT AND RECOMMENDATIONS:   1. Advance Care Planning/Goals of Care: Goals include to maximize quality of life and symptom management. No advance directive on file, will discuss on next meeting. Have asked POA several times to return MOSt.  2. Symptom Management:   Continues to have hearing deficits but understand her assistance is optimized. Will continue f/u with ENT  Complains of LE pain at hs. Recommend gabapentin 100 mg q hs for neuropathic LE pain.   She would like to be seen by beauty parlor once that opens again. Needs her bed side table repaired so it will not tip over.  3. Family /Caregiver/Community Supports:  Son Marie Perkins is POA. She was on of 7 siblings, and still has many living. 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  4. Cognitive / Functional decline: a and O x 3. HOH. Functionally at baseline.  5. Follow up Palliative Care Visit: Palliative care will continue to follow for goals of care clarification and symptom management. Return 6-8 weeks or prn.  I spent 35 minutes providing this consultation,  from 1000 to 1035. More than 50% of the time in this consultation was spent coordinating communication.   HISTORY OF PRESENT ILLNESS:  Makenze Ellett is a 86 y.o. year old female with  multiple medical problems including HOH, debility,CHF, Pain, HTN, OA, constipation, HLD, spinal stenosis, osteoporosis, SIADH weakness . Palliative Care was asked to follow this patient by consultation request of Marisa Hua, MD to help address advance care planning and goals of care. This is a follow up visit.  CODE STATUS: TBD, Full   PPS: 40% HOSPICE ELIGIBILITY/DIAGNOSIS: no  PAST MEDICAL HISTORY: CHF, Pain, HTN, OA, constipation, HLD, spinal stenosis, osteoporosis, SIADH SOCIAL HX:  Social History   Tobacco Use  . Smoking status: Not on file  Substance Use Topics  . Alcohol use: Not on file    ALLERGIES:  Allergies  Allergen Reactions  . Crestor [Rosuvastatin Calcium]   . Pravastatin   . Simvastatin      PERTINENT MEDICATIONS:  Outpatient Encounter Medications as of 11/29/2019  Medication Sig  . acetaminophen (TYLENOL) 650 MG CR tablet Take 650 mg by mouth in the morning and at bedtime. Not to exceed 3 gm / day  . benazepril (LOTENSIN) 20 MG tablet Take 20 mg by mouth 2 (two) times daily.  . bisacodyl (DULCOLAX) 5 MG EC tablet Take 5 mg by mouth daily.  . calcium carbonate (OSCAL) 1500 (600 Ca) MG TABS tablet Take by mouth daily. Once a day on MWF  . carvedilol (COREG) 3.125 MG tablet Take 3.125 mg by mouth every 12 (twelve) hours.  . diclofenac Sodium (VOLTAREN) 1 % GEL Apply 2 g topically 4 (four) times daily as needed. As needed to bilateral knees for pain  . furosemide (LASIX) 40  MG tablet Take 40 mg by mouth daily.  . Garlic 10 MG CAPS Take 1 tablet by mouth daily. On Sunday, Tues, Thurs and Saturday.  . Grape Seed 50 MG CAPS Take 1 capsule by mouth daily. On Sunday, Tues, Thurs and Sat.  Marland Kitchen HYDROcodone-acetaminophen (NORCO/VICODIN) 5-325 MG tablet Take 0.5 tablets by mouth every 6 (six) hours as needed. Mild pain  . HYDROcodone-acetaminophen (NORCO/VICODIN) 5-325 MG tablet Take 1 tablet by mouth every 6 (six) hours as needed for moderate pain.  . Lidocaine  (ASPERCREME LIDOCAINE) 4 % PTCH Apply 1 patch topically at bedtime. Apply in the pm and remove in the am.  . magnesium hydroxide (MILK OF MAGNESIA) 400 MG/5ML suspension Take 30 mLs by mouth daily as needed for mild constipation.  . Omega-3 Fatty Acids (EQL OMEGA 3 FISH OIL) 1000 MG CAPS Take 1 capsule by mouth every other day.  . polyethylene glycol (MIRALAX / GLYCOLAX) 17 g packet Take 17 g by mouth daily.   . Turmeric (QC TUMERIC COMPLEX) 500 MG CAPS Take 1 capsule by mouth daily.  . Vitamin D, Ergocalciferol, (DRISDOL) 1.25 MG (50000 UT) CAPS capsule Take 50,000 Units by mouth every 14 (fourteen) days. On Tuesday.   No facility-administered encounter medications on file as of 11/29/2019.    PHYSICAL EXAM / ROS:   Current and past weights: 135 lbs, gaining weight General: NAD, frail appearing, WNWD Cardiovascular: no chest pain reported, no edema  Pulmonary: no cough, no increased SOB, room air Abdomen: appetite good deniesconstipation, incontinent of bowel GU: denies dysuria, incontinent of urine MSK: +  joint and ROM abnormalities, ambulatory  With walker and stand by Skin: no rashes or wounds reported Neurological: Weakness,a and o x 3, c/o pain in LE, hearing loss  Marie Coop, NP [facility]  COVID-19 PATIENT SCREENING TOOL  Person answering questions: ____________Staff_______ _____   1.  Is the patient or any family member in the home showing any signs or symptoms regarding respiratory infection?               Person with Symptom- __________NA_________________  a. Fever                                                                          Yes___ No___          ___________________  b. Shortness of breath                                                    Yes___ No___          ___________________ c. Cough/congestion                                       Yes___  No___         ___________________ d. Body aches/pains  Yes___ No___        ____________________ e. Gastrointestinal symptoms (diarrhea, nausea)           Yes___ No___        ____________________  2. Within the past 14 days, has anyone living in the home had any contact with someone with or under investigation for COVID-19?    Yes___ No_X_   Person __________________

## 2020-02-27 ENCOUNTER — Other Ambulatory Visit: Payer: Self-pay

## 2020-02-27 ENCOUNTER — Other Ambulatory Visit: Payer: Medicare Other | Admitting: Primary Care

## 2020-02-27 DIAGNOSIS — Z515 Encounter for palliative care: Secondary | ICD-10-CM

## 2020-02-27 NOTE — Progress Notes (Signed)
[facility] Consult Note Telephone: [phone]  Fax: [phone]  PATIENT NAME: Marie Perkins [address] [place] [address] [phone] (home)  DOB: 1926/08/20 MRN: [id number]  PRIMARY CARE PROVIDER:    Marisa Hua, MD,  [place] [address] [phone]  REFERRING PROVIDER:   Marisa Hua, MD [address] [place],  [place] [address] [phone]  RESPONSIBLE PARTY:   Extended Emergency Contact Information Primary Emergency Contact: Elisabeth, Strom Address: [place]          [place], [place] [address] Home Phone: [phone] Relation: Son  I met face to face with patient in the facility.  ASSESSMENT AND RECOMMENDATIONS:   1. Advance Care Planning/Goals of Care: Goals include to maximize quality of life and symptom management.  I visited with Mrs. Stern in her nursing home room. She was pleasant, awake, alert and oriented x 3. She stated she had a good birthday last month and was able to see her children. She says that they're now on lockdown again and that she has not seen them in a while. They handle her finances and other aspects of her needs now that she has come to [facility].   2. Symptom Management:   Scalp eczema she endorses very itchy, uncomfortable scalp. Examination does reveals some bright silver plaques. She has Selsun blue but only has shampooing twice a week. I asked her to please request the bath attendant to take time with the shampoo and allow it to sit for 5 minutes on her scalp before rinsing. If this is not effective we may need to look at another solution. She may need to be seen by dermatology for scrapings.  Hearing: Her hearing remains difficult although she has hearing aids and she can care for them independently. She would like follow up for dental and eye exams when they are able to go out again. She said she has to natural teeth and the rest are  dentures. She has trouble chewing, especially raw vegetables. I recommend a dental assessment as soon as possible  3. Follow up Palliative Care Visit: Palliative care will continue to follow for goals of care clarification and symptom management. Return 8 weeks or prn.  4. Family /Caregiver/Community Supports: Son is POA, lives in [place].  5. Cognitive / Functional decline: A and O x 3, needs assistance with all alds.   I spent 35 minutes providing this consultation,  from 1400 to 1435. More than 50% of the time in this consultation was spent coordinating communication.   CHIEF COMPLAINT: Pruritis of scalp  HISTORY OF PRESENT ILLNESS:  Marie Perkins is a 86 y.o. year old female with multiple medical problems including HOH, debility,CHF, Pain, HTN, OA, constipation, HLD, spinal stenosis, osteoporosis, SIADH weakness .  Palliative Care was asked to follow this patient by consultation request of Marisa Hua, MD to help address advance care planning and goals of care. This is a follow up visit.  CODE STATUS  FULL code PPS: 40% HOSPICE ELIGIBILITY/DIAGNOSIS: no  PAST MEDICAL HISTORY: HOH, debility,CHF, Pain, HTN, OA, constipation, HLD, spinal stenosis, osteoporosis, SIADH weakness .  SOCIAL HX:  Social History   Tobacco Use  . Smoking status: Not on file  Substance Use Topics  . Alcohol use: Not on file   FAMILY HX: No family history on file.  ALLERGIES:  Allergies  Allergen Reactions  . Crestor [Rosuvastatin Calcium]   . Pravastatin   .  Simvastatin      PERTINENT MEDICATIONS:  Outpatient Encounter Medications as of 02/27/2020  Medication Sig  . acetaminophen (TYLENOL) 650 MG CR tablet Take 650 mg by mouth in the morning and at bedtime. Not to exceed 3 gm / day  . benazepril (LOTENSIN) 20 MG tablet Take 20 mg by mouth 2 (two) times daily.  . bisacodyl (DULCOLAX) 5 MG EC tablet Take 5 mg by mouth daily.  . calcium carbonate (OSCAL) 1500 (600 Ca) MG TABS tablet Take by mouth  daily. Once a day on MWF  . carvedilol (COREG) 3.125 MG tablet Take 3.125 mg by mouth every 12 (twelve) hours.  . diclofenac Sodium (VOLTAREN) 1 % GEL Apply 2 g topically 4 (four) times daily as needed. As needed to bilateral knees for pain  . furosemide (LASIX) 40 MG tablet Take 40 mg by mouth daily.  . Garlic 10 MG CAPS Take 1 tablet by mouth daily. On Sunday, Tues, Thurs and Saturday.  . Grape Seed 50 MG CAPS Take 1 capsule by mouth daily. On Sunday, Tues, Thurs and Sat.  Marland Kitchen HYDROcodone-acetaminophen (NORCO/VICODIN) 5-325 MG tablet Take 0.5 tablets by mouth every 6 (six) hours as needed. Mild pain  . HYDROcodone-acetaminophen (NORCO/VICODIN) 5-325 MG tablet Take 1 tablet by mouth every 6 (six) hours as needed for moderate pain.  . Lidocaine (ASPERCREME LIDOCAINE) 4 % PTCH Apply 1 patch topically at bedtime. Apply in the pm and remove in the am.  . magnesium hydroxide (MILK OF MAGNESIA) 400 MG/5ML suspension Take 30 mLs by mouth daily as needed for mild constipation.  . Omega-3 Fatty Acids (EQL OMEGA 3 FISH OIL) 1000 MG CAPS Take 1 capsule by mouth every other day.  . polyethylene glycol (MIRALAX / GLYCOLAX) 17 g packet Take 17 g by mouth daily.   . Turmeric (QC TUMERIC COMPLEX) 500 MG CAPS Take 1 capsule by mouth daily.  . Vitamin D, Ergocalciferol, (DRISDOL) 1.25 MG (50000 UT) CAPS capsule Take 50,000 Units by mouth every 14 (fourteen) days. On Tuesday.   No facility-administered encounter medications on file as of 02/27/2020.    PHYSICAL EXAM / ROS:   Current and past weights: stable  General: NAD, frail appearing, WNWD, HOH Cardiovascular: no chest pain reported, no edema  Pulmonary: no cough, no increased SOB, room air Abdomen: appetite good, denies constipation, continent of bowel GU: denies dysuria, continent of urine MSK:  ++ joint and ROM abnormalities, ambulatory with hemi walker and stand by Skin: no rashes or wounds reported Neurological: Weakness, denies pain, a and o x  3  Jason Coop, NP , DNP, MPH, [facility]   COVID-19 PATIENT SCREENING TOOL  Person answering questions: ___________Staff_____ _____   1.  Is the patient or any family member in the home showing any signs or symptoms regarding respiratory infection?               Person with Symptom- __________NA_________________  a. Fever                                                                          Yes___ No___          ___________________  b. Shortness of breath  Yes___ No___          ___________________ c. Cough/congestion                                       Yes___  No___         ___________________ d. Body aches/pains                                                         Yes___ No___        ____________________ e. Gastrointestinal symptoms (diarrhea, nausea)           Yes___ No___        ____________________  2. Within the past 14 days, has anyone living in the home had any contact with someone with or under investigation for COVID-19?    Yes___ No_X_   Person __________________   

## 2020-06-15 ENCOUNTER — Other Ambulatory Visit: Payer: Self-pay

## 2020-06-15 ENCOUNTER — Encounter: Payer: Self-pay | Admitting: Primary Care

## 2020-06-15 ENCOUNTER — Non-Acute Institutional Stay: Payer: Medicare Other | Admitting: Primary Care

## 2020-06-15 DIAGNOSIS — R531 Weakness: Secondary | ICD-10-CM | POA: Insufficient documentation

## 2020-06-15 DIAGNOSIS — M4800 Spinal stenosis, site unspecified: Secondary | ICD-10-CM | POA: Insufficient documentation

## 2020-06-15 DIAGNOSIS — R5381 Other malaise: Secondary | ICD-10-CM

## 2020-06-15 DIAGNOSIS — H9193 Unspecified hearing loss, bilateral: Secondary | ICD-10-CM

## 2020-06-15 DIAGNOSIS — M1990 Unspecified osteoarthritis, unspecified site: Secondary | ICD-10-CM | POA: Insufficient documentation

## 2020-06-15 DIAGNOSIS — R32 Unspecified urinary incontinence: Secondary | ICD-10-CM | POA: Insufficient documentation

## 2020-06-15 DIAGNOSIS — H9190 Unspecified hearing loss, unspecified ear: Secondary | ICD-10-CM | POA: Insufficient documentation

## 2020-06-15 DIAGNOSIS — E222 Syndrome of inappropriate secretion of antidiuretic hormone: Secondary | ICD-10-CM | POA: Insufficient documentation

## 2020-06-15 NOTE — Progress Notes (Signed)
[facility] Consult Note Telephone: [phone]  Fax: [phone]     Date of encounter: 06/15/20 PATIENT NAME: Marie Perkins [address] Marie Perkins [address] [phone] (home)  DOB: 29-Jan-1927 MRN: [id number]  PRIMARY CARE PROVIDER:    Alvester Morin, MD,  [place]. [place] [address] [phone]  REFERRING PROVIDER:   Alvester Morin, MD [place]. [place],  [place] [address] [phone]  RESPONSIBLE PARTY:   Extended Emergency Contact Information Primary Emergency Contact: [place], Bienkowski Address: [address]          [place], [place] [address] Home Phone: [phone] Relation: Son  I met face to face with patient  In the facility. Palliative Care was asked to follow this patient by consultation request of Slade-Hartman, Ivette Loyal* to help address advance care planning and goals of care. This is a follow up visit.   ASSESSMENT AND RECOMMENDATIONS:   1. Advance Care Planning/Goals of Care: Goals include to maximize quality of life and symptom management. Our advance care planning conversation included a discussion about:     The value and importance of advance care planning   Exploration of personal, cultural or spiritual beliefs that might influence medical decisions   Exploration of goals of care in the event of a sudden injury or illness   Identification of a healthcare agent - son   Creation of an  advance directive document   Decision not to resuscitate due to frailty- DNR was prepared and left in facility.  Discussed with son and POA pt's decision to elect DNR. We discussed her frailty and her understanding that CPR would be very traumatic. She would like supportive care but son states DNR is reasonable given her frailty.   Symptom Management:     Ear Exam: Needs Lucianne Lei transport to Duaine Dredge  MD in [facility] who is her otolaryngologist. 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 Scheduled for late January.   Scalp dermatitis: Endorses this is still a problem. Please refer to dermatology for diagnosis and treatment.   Mobility: Endorse walking She would like to take the [place] and not a car for transport now. Discussed with son the transport experience. Pt does not want to transport in a car again due to fear of falling.   Incontinence: Has constant urination. Please consider a urinary anti spasmodic for trial.eg ditropan or myrbetriq. She could try an empiric trial to see if this helps.  Skin integrity: Endorses skin break down, please use zinc to perineal skin qid.   3. Follow up Palliative Care Visit: Palliative care will continue to follow for goals of care clarification and symptom management. Return 6 weeks or prn.  4. Family /Caregiver/Community Supports: Son Is POA, helps with decisions, lives in [place].  5. Cognitive / Functional decline: A and O x 3, endorses increased weakness.  I spent 50 minutes providing this consultation,  from 1500 to 1550 More than 50% of the time in this consultation was spent coordinating communication.   CODE STATUS:DNR  PPS: 50%   HOSPICE ELIGIBILITY/DIAGNOSIS: no Subjective:  CHIEF COMPLAINT: Advance care planning  HISTORY OF PRESENT ILLNESS:  Marie Perkins is a 86 y.o. year old female  with debility, immobility, incontinence,  Some h/o constipation and scalp dermatitis. She needs work up of several  Health issues.  We are asked to consult around advance care planning and complex medical decision making.   Review of case  with family member son Marie Perkins.  History obtained from review of EMR, discussion with primary team, and  interview with family, caregiver  and/or Ms. Schweizer. Records reviewed and summarized above.   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     ALLERGIES:  Allergies  Allergen Reactions   Crestor [Rosuvastatin Calcium]    Pravastatin    Simvastatin      PERTINENT MEDICATIONS:  Outpatient Encounter Medications as of 06/15/2020  Medication Sig   acetaminophen (TYLENOL) 650 MG CR tablet Take 650 mg by mouth in the morning and at bedtime. Not to exceed 3 gm / day   benazepril (LOTENSIN) 20 MG tablet Take 20 mg by mouth 2 (two) times daily.   bisacodyl (DULCOLAX) 5 MG EC tablet Take 5 mg by mouth daily.   carvedilol (COREG) 3.125 MG tablet Take 3.125 mg by mouth every 12 (twelve) hours.   cholecalciferol (VITAMIN D3) 25 MCG (1000 UNIT) tablet Take 2,000 Units by mouth daily.   diclofenac Sodium (VOLTAREN) 1 % GEL Apply 2 g topically 4 (four) times daily as needed. As needed to bilateral knees for pain   furosemide (LASIX) 40 MG tablet Take 40 mg by mouth daily.   gabapentin (NEURONTIN) 100 MG capsule Take 100 mg by mouth at bedtime.   HYDROcodone-acetaminophen (NORCO/VICODIN) 5-325 MG tablet Take 0.5 tablets by mouth at bedtime. Mild pain   HYDROcodone-acetaminophen  (NORCO/VICODIN) 5-325 MG tablet Take 1 tablet by mouth every 6 (six) hours as needed for moderate pain.   Lidocaine 4 % PTCH Apply 1 patch topically at bedtime. Apply in the pm and remove in the am.   Multiple Vitamin (MULTIVITAMIN) tablet Take 1 tablet by mouth daily.   Omega-3 Fatty Acids (EQL OMEGA 3 FISH OIL) 1000 MG CAPS Take 1 capsule by mouth every other day.   polyethylene glycol (MIRALAX / GLYCOLAX) 17 g packet Take 17 g by mouth daily.    [DISCONTINUED] Vitamin D, Ergocalciferol, (DRISDOL) 1.25 MG (50000 UT) CAPS capsule Take 50,000 Units by mouth every 14 (fourteen) days. On Tuesday.   calcium carbonate (OSCAL) 1500 (600 Ca) MG TABS tablet Take by mouth daily. Once a day on MWF   magnesium hydroxide (MILK OF MAGNESIA) 400 MG/5ML suspension Take 30 mLs by mouth daily as needed for mild constipation. (Patient not taking: Reported on 06/15/2020)   [DISCONTINUED] Garlic 10 MG CAPS Take 1 tablet by mouth daily. On Sunday, Tues, Thurs and Saturday.   [  DISCONTINUED] Grape Seed 50 MG CAPS Take 1 capsule by mouth daily. On Sunday, Tues, Thurs and Sat.   [DISCONTINUED] Turmeric (QC TUMERIC COMPLEX) 500 MG CAPS Take 1 capsule by mouth daily.   No facility-administered encounter medications on file as of 06/15/2020.   Objective: ROS  General: NAD EYES: denies vision changes, wears glasses ENMT: denies dysphagia Cardiovascular: denies chest pain Pulmonary: denies  cough, denies increased SOB Abdomen: endorses good appetite, endorses  occ constipation, endorses incontinence of bowel GU: denies dysuria, endorses ubcontinence of urine MSK:  endorses ROM limitations, no falls reported, fears falling Skin: denies rashes or wounds Neurological: endorses general weakness, endorses occ pain, denies insomnia Psych: Endorses positive mood Heme/lymph/immuno: denies bruises, abnormal bleeding  Physical Exam: Current and past weights:140 lbs, a gain of several lbs Constitutional:   NAD General: frail appearing, WNWD EYES: anicteric sclera,lids intact, no discharge  ENMT:impaired  hearing,oral mucous membranes moist, dentition intact CV: no LE edema Pulmonary:no increased work of breathing, no cough, no audible wheezes, room air Abdomen: intake 75%, no ascites GU: deferred MSK: mod sarcopenia, decreased ROM in all extremities, no contractures of LE,  Ambulatory with walker and stand by Skin: warm and dry, no rashes or wounds on visible skin Neuro: Generalized weakness, no cognitive impairment, grossly non -focal Psych: non-anxious affect, A and O x 3 Hem/lymph/immuno: no widespread bruising   Thank you for the opportunity to participate in the care of Ms. Heupel.  The palliative care team will continue to follow. Please call our office at [phone] if we can be of additional assistance.  Jason Coop, NP , DNP, MPH, AGPCNP-BC, ACHPN  COVID-19 PATIENT SCREENING TOOL  Person answering questions: ___________staff 1.  Is the patient or any family member in the home showing any signs or symptoms regarding respiratory infection?               Person with Symptom- __________NA_________________  a. Fever                                                                          Yes___ No___          ___________________  b. Shortness of breath                                                    Yes___ No___          ___________________ c. Cough/congestion                                       Yes___  No___         ___________________ d. Body aches/pains                                                         Yes___ No___  ____________________ e. Gastrointestinal symptoms (diarrhea, nausea)           Yes___ No___        ____________________  2. Within the past 14 days, has anyone living in the home had any contact with someone with or under investigation for COVID-19?    Yes___ No_X_   Person __________________

## 2020-08-07 ENCOUNTER — Other Ambulatory Visit: Payer: Self-pay

## 2020-08-07 ENCOUNTER — Non-Acute Institutional Stay: Payer: Medicare Other | Admitting: Primary Care

## 2020-08-07 DIAGNOSIS — R5381 Other malaise: Secondary | ICD-10-CM

## 2020-08-07 DIAGNOSIS — M1990 Unspecified osteoarthritis, unspecified site: Secondary | ICD-10-CM

## 2020-08-07 DIAGNOSIS — H9193 Unspecified hearing loss, bilateral: Secondary | ICD-10-CM

## 2020-08-07 DIAGNOSIS — Z515 Encounter for palliative care: Secondary | ICD-10-CM

## 2020-08-07 DIAGNOSIS — M4800 Spinal stenosis, site unspecified: Secondary | ICD-10-CM

## 2020-08-07 NOTE — Progress Notes (Signed)
[facility] Consult Note Telephone: [phone]  Fax: [phone]     Date of encounter: 08/07/20 PATIENT NAME: Marie Perkins [place] [address] [phone] (home)  DOB: 1926-07-06 MRN: [id number]  PRIMARY CARE PROVIDER:    Alvester Morin, MD,  [place]. [place] [address] [phone]  REFERRING PROVIDER:   Alvester Morin, MD [place]. [place],  [place] [address] [phone]  RESPONSIBLE PARTY:   Extended Emergency Contact Information Primary Emergency Contact: Canary, Fister Address: [address] [place]          [place], [place] [address] Home Phone: [phone] Relation: Son  I met face to face with patient in the facility. Palliative Care was asked to follow this patient by consultation request of Slade-Hartman, Ivette Loyal* to help address advance care planning and goals of care. This is a follow up  visit.   ASSESSMENT AND RECOMMENDATIONS:   1. Advance Care Planning/Goals of Care: Goals include to maximize quality of life and symptom management. Our advance care planning conversation included a discussion about:     The value and importance of advance care planning   Experiences with loved ones who have been seriously ill or have died   Exploration of personal, cultural or spiritual beliefs that might influence medical decisions   Exploration of goals of care in the event of a sudden injury or illness   Identification and preparation of a healthcare agent   Review and updating or creation of an  advance directive document .  Decision not to resuscitate or to de-escalate disease focused treatments due to poor prognosis.  2. Symptom Management:   Ear f/u: Requests to go to [facility] in [place] on the [address]. Please schedule every 4 months.  She has an ongoing wax issue that needs ENT surveillnce.  Safety: Requests a name band for  identification. Ambulates with assistance.  Nutrition: Eating well, stable weights. Endorse some bite misalignment of dentures.  3. Follow up Palliative Care Visit: Palliative care will continue to follow for goals of care clarification and symptom management. Return 6-8 weeks or prn.  4. Family /Caregiver/Community Supports: Family supportive with calls, visits, etc. Lives in [place].  5. Cognitive / Functional decline: A and O x 3, dependent in most adls, all iadls.  I spent 25 minutes providing this consultation,  from 1300 to 1325. More than 50% of the time in this consultation was spent in counseling and care coordination.  CODE STATUS: DNR  PPS: 40%  HOSPICE ELIGIBILITY/DIAGNOSIS: no Subjective:  CHIEF COMPLAINT: HOH  HISTORY OF PRESENT ILLNESS:  Marie Perkins is a 87 y.o. year old female  with OA severe hearing loss, spinal stenosis, debility. Currently thriving well in environment with assistance.   We are asked to consult around advance care planning and complex medical decision making.    History obtained from review of EMR, discussion with primary team, and  interview with family, caregiver  and/or Ms. Service. Records reviewed and summarized above.   CURRENT PROBLEM LIST:  Patient Active Problem List   Diagnosis Date Noted  . Incontinence in female 06/15/2020  . Hearing loss 06/15/2020  . Spinal stenosis 06/15/2020  . Debility 06/15/2020  . 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 Chronic arthritis 06/15/2020   PAST MEDICAL HISTORY:  Active Ambulatory Problems    Diagnosis Date Noted  . Incontinence in female 06/15/2020  . Hearing loss  06/15/2020  . Spinal stenosis 06/15/2020  . Debility 06/15/2020  . 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 Chronic arthritis 06/15/2020   Resolved Ambulatory Problems    Diagnosis Date Noted  . No Resolved Ambulatory Problems   Past Medical History:  Diagnosis Date  .  Asteatosis cutis   . Heart failure, unspecified ([place])   . Senile osteoporosis   . Simple constipation    SOCIAL HX:  Social History   Tobacco Use  . Smoking status: Not on file  . Smokeless tobacco: Not on file  Substance Use Topics  . Alcohol use: Not on file   FAMILY HX: No family history on file.    ALLERGIES:  Allergies  Allergen Reactions  . Crestor [Rosuvastatin Calcium]   . Pravastatin   . Simvastatin      PERTINENT MEDICATIONS:  Outpatient Encounter Medications as of 08/07/2020  Medication Sig  . acetaminophen (TYLENOL) 650 MG CR tablet Take 650 mg by mouth in the morning and at bedtime. Not to exceed 3 gm / day  . benazepril (LOTENSIN) 20 MG tablet Take 20 mg by mouth 2 (two) times daily.  . bisacodyl (DULCOLAX) 5 MG EC tablet Take 5 mg by mouth daily.  . calcium carbonate (OSCAL) 1500 (600 Ca) MG TABS tablet Take by mouth daily. Once a day on MWF  . carvedilol (COREG) 3.125 MG tablet Take 3.125 mg by mouth every 12 (twelve) hours.  . cholecalciferol (VITAMIN D3) 25 MCG (1000 UNIT) tablet Take 2,000 Units by mouth daily.  . diclofenac Sodium (VOLTAREN) 1 % GEL Apply 2 g topically 4 (four) times daily as needed. As needed to bilateral knees for pain  . furosemide (LASIX) 40 MG tablet Take 40 mg by mouth daily.  Marland Kitchen gabapentin (NEURONTIN) 100 MG capsule Take 100 mg by mouth at bedtime.  Marland Kitchen HYDROcodone-acetaminophen (NORCO/VICODIN) 5-325 MG tablet Take 0.5 tablets by mouth at bedtime. Mild pain  . HYDROcodone-acetaminophen (NORCO/VICODIN) 5-325 MG tablet Take 1 tablet by mouth every 6 (six) hours as needed for moderate pain.  . Lidocaine 4 % PTCH Apply 1 patch topically at bedtime. Apply in the pm and remove in the am.  . Multiple Vitamin (MULTIVITAMIN) tablet Take 1 tablet by mouth daily.  . Omega-3 Fatty Acids (EQL OMEGA 3 FISH OIL) 1000 MG CAPS Take 1 capsule by mouth every other day.  . polyethylene glycol (MIRALAX / GLYCOLAX) 17 g packet Take 17 g by mouth daily.   .  magnesium hydroxide (MILK OF MAGNESIA) 400 MG/5ML suspension Take 30 mLs by mouth daily as needed for mild constipation. (Patient not taking: No sig reported)   No facility-administered encounter medications on file as of 08/07/2020.    Objective: ROS  General: NAD EYES: denies vision changes, wears glasses ENMT: denies dysphagia, endorse hearing loss Cardiovascular: denies chest pain Pulmonary: denies  cough, denies increased SOB Abdomen: endorses good appetite, denies  constipation, endorses continence of bowel GU: denies dysuria, endorses continence of urine MSK:  endorses ROM limitations, no falls reported Skin: denies rashes or wounds Neurological: endorses weakness, denies pain, denies insomnia Psych: Endorses positive mood Heme/lymph/immuno: denies bruises, abnormal bleeding  Physical Exam: Current and past weights: stable at 138 lbs. Constitutional: , NAD General: frail appearing, WNWD EYES: anicteric sclera, lids intact, no discharge  ENMT: deficits of hearing,oral mucous membranes moist, dentures CV:  no LE edema Pulmonary:  no increased work of breathing, no cough, no audible wheezes, room air Abdomen: intake 100%,  no ascites  GU: deferred MSK: mod. sarcopenia, decreased ROM in all extremities, no contractures of LE,  ambulatory Skin: warm and dry, no rashes or wounds on visible skin Neuro: Generalized weakness, hearing loss Psych: non-anxious affect, A and O x 3 Hem/lymph/immuno: no widespread bruising   Thank you for the opportunity to participate in the care of Ms. Sano.  The palliative care team will continue to follow. Please call our office at [phone] if we can be of additional assistance.  Jason Coop, NP , DNP, MPH, AGPCNP-BC, ACHPN   COVID-19 PATIENT SCREENING TOOL  Person answering questions: _______staff____________   1.  Is the patient or any family member in the home showing any signs or symptoms regarding respiratory infection?                   Person with Symptom  ______________na___________ a. Fever/chills/headache                                                        Yes___ No__X_            b. Shortness of breath                                                            Yes___ No__X_           c. Cough/congestion                                               Yes___  No__X_          d. Muscle/Body aches/pains                                                   Yes___ No__X_         e. Gastrointestinal symptoms (diarrhea,nausea)             Yes___ No__X_         f. Sudden loss of smell or taste      Yes___ No__X_        2. Within the past 10 days, has anyone living in the home had any contact with someone with or under investigation for COVID-19?    Yes___ No__X__   Person __________________

## 2021-01-01 ENCOUNTER — Other Ambulatory Visit: Payer: Self-pay

## 2021-01-01 ENCOUNTER — Non-Acute Institutional Stay: Payer: Medicare Other | Admitting: Primary Care

## 2021-01-01 DIAGNOSIS — H9193 Unspecified hearing loss, bilateral: Secondary | ICD-10-CM

## 2021-01-01 DIAGNOSIS — R5381 Other malaise: Secondary | ICD-10-CM

## 2021-01-01 DIAGNOSIS — M1990 Unspecified osteoarthritis, unspecified site: Secondary | ICD-10-CM

## 2021-01-01 DIAGNOSIS — M4800 Spinal stenosis, site unspecified: Secondary | ICD-10-CM

## 2021-01-01 DIAGNOSIS — Z515 Encounter for palliative care: Secondary | ICD-10-CM

## 2021-01-01 NOTE — Progress Notes (Signed)
  [facility] Consult Note Telephone: [phone]  Fax: [phone]    Date of encounter: 01/01/21 PATIENT NAME: Marie Perkins [address] [facility] [place] [address]   [phone] (home)  DOB: 10/30/1926 MRN: [id number] PRIMARY CARE PROVIDER:    Slade-Hartman, Venezela, MD,  [address] [place] [address] [phone]  REFERRING PROVIDER:   Slade-Hartman, Venezela, MD [address] [place],  [place] [address] [phone]  RESPONSIBLE PARTY:    Contact Information     Name Relation Home Work Mobile   Perkins, Marie Son [phone]          I met face to face with patient in [facility] facility. Palliative Care was asked to follow this patient by consultation request of  Marie Perkins, Marie Perkins* to address advance care planning and complex medical decision making. This is a follow up visit.                                   ASSESSMENT AND PLAN / RECOMMENDATIONS:   Advance Care Planning/Goals of Care: Goals include to maximize quality of life and symptom management. Our advance care planning conversation included a discussion about:     Exploration of personal, cultural or spiritual beliefs that might influence medical decisions   CODE STATUS: DNR  Symptom Management/Plan:  I met with patient in her nursing home room. She states that she likes her new room although the bathroom is not as easy for her to negotiate. She needs assistance to the bathroom for fall safety. She is at her physical and cognitive baseline. She does not have cognitive impairment but she is severely sensory deprived for hearing. Even with hearing aids, her hearing is very poor and impacts her ability to understand conversation.   She is concerned about a L elbow lesion from an injury. It has been covered with a dressing but that has become dislodged. We asked SNF staff to address. It appears to be healing  well.  She does state that she feels she's doing well at her 89nd birthday is approaching. She was able to update me on her family and that they are able to visit her every several weeks. This is a highlight in her life. She does need extensive assistance with set up and assistance with toileting and ADLs. She enjoys having her blinds open during the day so that she can watch the birds and look outside.   Follow up Palliative Care Visit: Palliative care will continue to follow for complex medical decision making, advance care planning, and clarification of goals. Return 3-4 months or prn.  I spent 25 minutes providing this consultation. More than 50% of the time in this consultation was spent in counseling and care coordination.  PPS: 30%  HOSPICE ELIGIBILITY/DIAGNOSIS: TBD  Chief Complaint: debility  HISTORY OF PRESENT ILLNESS:  Marie Perkins is a 87 y.o. year old female  with Spinal stenosis, hearing loss, debility, L elbow skin tear.  History obtained from review of EMR, discussion with primary team, and interview with family, facility staff/caregiver and/or Marie Perkins.  I reviewed available labs, medications, imaging, studies and related documents from the EMR.  Records reviewed and summarized above.   ROS   General: NAD EYES: denies vision changes, endorses HOH Pulmonary: denies cough, denies increased SOB Abdomen: endorses good appetite, denies constipation, endorses occ incontinence of bowel GU: denies   dysuria, endorses occ incontinence of urine MSK:  endorses weakness,  no falls reported Skin: Endorses L elbow skin tear Neurological: endorses occ  pain, denies insomnia Psych: Endorses positive mood Heme/lymph/immuno: denies bruises, abnormal bleeding  Physical Exam: Current and past weights: Constitutional: NAD General: frail appearing, tWNWD EYES: anicteric sclera, lids intact, no discharge  ENMT: severe loss of  hearing, oral mucous membranes moist CV:  no LE  edema Pulmonary: no increased work of breathing, no cough, room air Abdomen: intake 75%, no ascites GU: deferred MSK: moderate sarcopenia, moves all extremities, ambulatory with device and assistance Skin: warm and dry, L superficial elbow wound Neuro:  + generalized weakness,  no cognitive impairment Psych:  occ anxious affect, A and O x 3 Hem/lymph/immuno: no widespread bruising  Thank you for the opportunity to participate in the care of Marie Perkins.  The palliative care team will continue to follow. Please call our office at [phone] if we can be of additional assistance.    McKelvey Khamis, NP , DNP, MPH, AGPCNP-BC, ACHPN  COVID-19 PATIENT SCREENING TOOL Asked and negative response unless otherwise noted:   Have you had symptoms of covid, tested positive or been in contact with someone with symptoms/positive test in the past 5-10 days?   

## 2021-04-22 ENCOUNTER — Non-Acute Institutional Stay: Payer: Medicare Other | Admitting: Primary Care

## 2021-04-22 ENCOUNTER — Other Ambulatory Visit: Payer: Self-pay

## 2021-04-22 DIAGNOSIS — Z515 Encounter for palliative care: Secondary | ICD-10-CM

## 2021-04-22 DIAGNOSIS — H9193 Unspecified hearing loss, bilateral: Secondary | ICD-10-CM

## 2021-04-22 DIAGNOSIS — R5381 Other malaise: Secondary | ICD-10-CM

## 2021-04-22 DIAGNOSIS — M1990 Unspecified osteoarthritis, unspecified site: Secondary | ICD-10-CM

## 2021-04-22 NOTE — Progress Notes (Signed)
[facility] Consult Note Telephone: [phone]  Fax: [phone]    Date of encounter: 04/22/21 2:26 PM PATIENT NAME: Marie Perkins   [phone] (home)  DOB: [birth year] MRN: [id number] PRIMARY CARE PROVIDER:    Alvester Morin, MD,  [address] [place] [address] [phone]  REFERRING PROVIDER:   Alvester Morin, MD [address] [place],  [place] [address] [phone]  RESPONSIBLE PARTY:    Contact Information     Name Relation Home Work Mobile   Marie Perkins [phone]         [facility]. Palliative Care was asked to follow this patient by consultation request of  Slade-Hartman, Ivette Loyal* to address advance care planning and complex medical decision making. This is a follow up visit.                                   ASSESSMENT AND PLAN / RECOMMENDATIONS:   Advance Care Planning/Goals of Care: Goals include to maximize quality of life and symptom management. Our advance care planning conversation included a discussion about:     Exploration of personal, cultural or spiritual beliefs that might influence medical decisions  Identification  of a healthcare agent Marie Perkins, visiting today and met with [place]. CODE STATUS: DNR  Symptom Management/Plan:  Cataracts- Has but may not Rx due to onerous treatment plan. Son states 4  appts needed per eye, and pt does not feel she can do this.  Depression: Family would like anti depressant, poa has asked for this. I recommend sertraline 25 mg po x 2 weeks then increase to 50 mg daily.  Respiratory:  Has had neb following a URI some weeks ago.Lungs clear, pulse ox 92% RA.  Follow up Palliative Care Visit: Palliative care will continue to follow for complex medical decision making, advance care planning, and clarification of  goals. Return 6-8  weeks or prn.  I spent 25 minutes providing this consultation. More than 50% of the time in this consultation was spent in counseling and care coordination.  PPS: 30%  HOSPICE ELIGIBILITY/DIAGNOSIS: no   Chief Complaint: immobility, depression  HISTORY OF PRESENT ILLNESS:  Marie Perkins is a 87 y.o. year old female  with debility, family endorses anxiety and depression. Patient also endorses pain, and would like to try sertraline. Son is also looking into fixing lower dentures which are broken.   History obtained from review of EMR, discussion with primary team, and interview with family, facility staff/caregiver and/or Ms. Zappia.  I reviewed available labs, medications, imaging, studies and related documents from the EMR.  Records reviewed and summarized above.   ROS  General: NAD EYES: denies vision changes ENMT: denies dysphagia, lower dentures broken  Cardiovascular: denies chest pain, denies DOE Pulmonary: denies cough, denies increased SOB Abdomen: endorses good appetite, denies constipation, endorses  occ incontinence of bowel GU: denies dysuria, endorses  occ incontinence of urine MSK:  endorses weakness,  no falls reported Skin: denies rashes or wounds Neurological: endorses joint  pain, denies insomnia Psych: Endorses positive mood Heme/lymph/immuno: denies bruises, abnormal bleeding  Physical Exam: Current and past weights: 145 lbs, gain Constitutional: NAD General: frail appearing, WNWD EYES: anicteric sclera, lids intact, no discharge , reports cataracts, new glasses ENMT: very hard  of hearing, has q 3 months ear cleaning with ENT, oral mucous membranes moist, dentition intact CV: S1S2, RRR, no LE edema Pulmonary: LCTA, no increased work of breathing, no cough, room air Abdomen: intake 75%, normo-active BS + 4 quadrants, soft and non tender, no ascites GU: deferred MSK: + sarcopenia, moves all extremities,  non ambulatory Skin: warm and dry,  no rashes or wounds on visible skin Neuro:  no generalized weakness,  no cognitive impairment Psych: non-anxious affect, A and O x 3 Hem/lymph/immuno: no widespread bruising  Thank you for the opportunity to participate in the care of Marie Perkins.  The palliative care team will continue to follow. Please call our office at [phone] if we can be of additional assistance.   Marie Coop, NP DNP, AGPCNP-BC  COVID-19 PATIENT SCREENING TOOL Asked and negative response unless otherwise noted:   Have you had symptoms of covid, tested positive or been in contact with someone with symptoms/positive test in the past 5-10 days?

## 2021-07-13 ENCOUNTER — Other Ambulatory Visit: Payer: Medicare Other | Admitting: Primary Care

## 2021-07-13 ENCOUNTER — Other Ambulatory Visit: Payer: Self-pay

## 2021-07-13 VITALS — BP 101/60 | Temp 98.2°F | Ht 60.0 in | Wt 140.0 lb

## 2021-07-13 DIAGNOSIS — R5382 Chronic fatigue, unspecified: Secondary | ICD-10-CM

## 2021-07-13 DIAGNOSIS — U099 Post covid-19 condition, unspecified: Secondary | ICD-10-CM

## 2021-07-13 DIAGNOSIS — Z7409 Other reduced mobility: Secondary | ICD-10-CM

## 2021-07-13 NOTE — Addendum Note (Signed)
Addended by: Marijo File on: 07/13/2021 07:53 PM   Modules accepted: Level of Service

## 2021-07-13 NOTE — Progress Notes (Signed)
[facility] Consult Note Telephone: [phone]  Fax: [phone]    Date of encounter: 07/13/21 7:06 PM PATIENT NAME: Marie Perkins [id number]   [phone] (home)  DOB: 1926-11-21 MRN: [id number] PRIMARY CARE PROVIDER:    Alvester Morin, MD,  [address]. [place] [address] [phone]  REFERRING PROVIDER:   Alvester Morin, MD [address] [place],  [place] [address] [phone]  RESPONSIBLE PARTY:    Contact Information     Name Relation Home Work Mobile   Marie, Perkins [phone]         I met face to face with patient and family  Marie Perkins in  [facility]. Palliative Care was asked to follow this patient by consultation request of  Slade-Hartman, Ivette Loyal* to address advance care planning and complex medical decision making. This is a follow up visit.                                   ASSESSMENT AND PLAN / RECOMMENDATIONS:   Advance Care Planning/Goals of Care: Goals include to maximize quality of life and symptom management. Patient/health care surrogate gave his/her permission to discuss.Our advance care planning conversation included a discussion about:    The value and importance of advance care planning  Exploration of personal, cultural or spiritual beliefs that might influence medical decisions  Exploration of goals of care in the event of a sudden injury or illness  Identification of a healthcare agent - Marie Perkins her son creation of an  advance directive document . - discussed most and POA son Marie Perkins reiterated DNR. Discussed  de escalation of care Decision to de-escalate disease focused treatments due to poor prognosis. CODE STATUS: DNR Advance care planning I discussed with her son the MOST. We had discussed this in the past and he has been reticent to make decisions but now states he and his sister feel that  conservative measures would serve her the best. They would like to review it together but are considering comfort measures in place,  and no re-hospitalizations. I will upload the most form once it is returned.   We discussed her frailty and successes of extraordinary measures such as CPR being minimal at her age and stage. Patient is interactive and oriented. She has her hearing aids in today which allows her to converse but she is fatigued. It is also later in the day  but she and her son both endorsed that she has had some better days recently.  I spent 25 minutes providing this consultation. More than 50% of the time in this consultation was spent in counseling and care coordination. ---------------------------------------------------------------------------------------------------------  Symptom Management/Plan:  I met with patient and her son in her nursing home room. Son endorses decline since her Covid infection 3-4 weeks ago.  Mobility: She is not in her chair, spending most time in bed. She endorses pain on using the sit to stand lift. No pain at rest.  I do recommend she have some restorative therapy as she was fairly functional before the infection.   Nutrition: Speech has recently come in and cleared her for advancing her diet. Her intake has not been as good as her baseline but she is looking forward to soft diet and improved food choices.  Hypotension she and her son both endure some hypotension and a  dizzy episode over the weekend. I would recommend reviewing her antihypertensive as she is spending more time prone, her intake has decreased, and her activity has decreased.  BP values reviewed and look to be normotensive for the most part. Continue to help pt change position slowly and avoid falls with gradual position change. She should also advance her mobility as tolerate.   Follow up Palliative Care Visit: Palliative care will continue to follow for complex medical decision making,  advance care planning, and clarification of goals. Return 2 weeks or prn.  This visit was coded based on medical decision making (MDM).  PPS: 30%  HOSPICE ELIGIBILITY/DIAGNOSIS: TBD  Chief Complaint: debility, recent covid infection with decline  HISTORY OF PRESENT ILLNESS:  Marie Perkins is a 88 y.o. year old female  with recent covid 19 infection, debility, fall risk, dizziness, orthostasis .   History obtained from review of EMR, discussion with primary team, and interview with family, facility staff/caregiver and/or [name].  I reviewed available labs, medications, imaging, studies and related documents from the EMR.  Records reviewed and summarized above.   ROS   General: NAD EYES: denies vision changes, has glasses  ENMT: denies dysphagia Pulmonary: denies cough, denies increased SOB Abdomen: endorses fair  appetite, denies constipation, endorses continence of bowel GU: denies dysuria, endorses continence of urine MSK:  denies  increased weakness,  no falls reported Skin: denies rashes , endorses R elbow wound Neurological: denies pain, denies insomnia, dizziness x 1, poss. orthostasis Psych: Endorses flat mood, good days/bad days Heme/lymph/immuno: denies bruises, abnormal bleeding  Physical Exam: Current and past weights:140 lbs, 4 lb loss in 4 months. Today's Vitals   07/13/21 1915  BP: 101/60  Temp: 98.2 F (36.8 C)  SpO2: 95%  Weight: 140 lb (63.5 kg)  Height: 5' (1.524 m)  PainSc: 0-No pain   Body mass index is 27.34 kg/m.  Constitutional: NAD  General: frail appearing, thin EYES: anicteric sclera, lids intact, no discharge  ENMT :hard of  hearing, oral mucous membranes moist, dentition intact CV: S1S2, RRR, no LE edema Pulmonary: LCTA, no increased work of breathing, no cough, room air Abdomen: intake 100%, normo-active BS + 4 quadrants, soft and non tender, no ascites GU: deferred MSK: ++ sarcopenia, moves all extremities,  non ambulatory Skin:  warm and dry,  R elbow pressure injury, stage 2. Neuro:  increased  generalized weakness,  mild  cognitive impairment, drowsy Psych: non-anxious affect, A and O x 2-3 Hem/lymph/immuno: no widespread bruising    Outpatient Encounter Medications as of 07/13/2021  Medication Sig   acetaminophen (TYLENOL) 650 MG CR tablet Take 650 mg by mouth in the morning and at bedtime. Not to exceed 3 gm / day   benazepril (LOTENSIN) 20 MG tablet Take 20 mg by mouth 2 (two) times daily.   bisacodyl (DULCOLAX) 5 MG EC tablet Take 5 mg by mouth daily.   carvedilol (COREG) 3.125 MG tablet Take 3.125 mg by mouth every 12 (twelve) hours.   cholecalciferol (VITAMIN D3) 25 MCG (1000 UNIT) tablet Take 2,000 Units by mouth daily.   furosemide (LASIX) 40 MG tablet Take 40 mg by mouth daily.   gabapentin (NEURONTIN) 100 MG capsule Take 100 mg by mouth at bedtime.   HYDROcodone-acetaminophen (NORCO/VICODIN) 5-325 MG tablet Take 0.5 tablets by mouth at bedtime. Mild pain   HYDROcodone-acetaminophen (NORCO/VICODIN) 5-325 MG tablet Take 1 tablet by mouth every 6 (six) hours as needed for moderate pain.   ipratropium-albuterol (DUONEB) 0.5-2.5 (3)  MG/3ML SOLN Take 3 mLs by nebulization every 6 (six) hours as needed.   Lidocaine (ASPERCREME LIDOCAINE) 4 % PTCH Apply 1 patch topically every 12 (twelve) hours.   Multiple Vitamin (MULTIVITAMIN) tablet Take 1 tablet by mouth daily.   Omega-3 Fatty Acids (EQL OMEGA 3 FISH OIL) 1000 MG CAPS Take 1 capsule by mouth every other day.   polyethylene glycol (MIRALAX / GLYCOLAX) 17 g packet Take 17 g by mouth daily.    polyvinyl alcohol (LIQUIFILM TEARS) 1.4 % ophthalmic solution 1 drop at bedtime.   [DISCONTINUED] calcium carbonate (OSCAL) 1500 (600 Ca) MG TABS tablet Take by mouth daily. Once a day on MWF   [DISCONTINUED] diclofenac Sodium (VOLTAREN) 1 % GEL Apply 2 g topically 4 (four) times daily as needed. As needed to bilateral knees for pain   [DISCONTINUED] Lidocaine 4 % PTCH  Apply 1 patch topically at bedtime. Apply in the pm and remove in the am.   [DISCONTINUED] magnesium hydroxide (MILK OF MAGNESIA) 400 MG/5ML suspension Take 30 mLs by mouth daily as needed for mild constipation. (Patient not taking: No sig reported)   No facility-administered encounter medications on file as of 07/13/2021.    Thank you for the opportunity to participate in the care of Ms. Herringshaw.  The palliative care team will continue to follow. Please call our office at [phone] if we can be of additional assistance.   Jason Coop, NP DNP, AGPCNP-BC  COVID-19 PATIENT SCREENING TOOL Asked and negative response unless otherwise noted:   Have you had symptoms of covid, tested positive or been in contact with someone with symptoms/positive test in the past 5-10 days?

## 2021-07-28 ENCOUNTER — Other Ambulatory Visit: Payer: Self-pay

## 2021-07-28 ENCOUNTER — Other Ambulatory Visit: Payer: Medicare Other | Admitting: Primary Care

## 2021-07-28 DIAGNOSIS — M4800 Spinal stenosis, site unspecified: Secondary | ICD-10-CM

## 2021-07-28 DIAGNOSIS — Z515 Encounter for palliative care: Secondary | ICD-10-CM

## 2021-07-28 DIAGNOSIS — R5381 Other malaise: Secondary | ICD-10-CM

## 2021-07-28 DIAGNOSIS — H9193 Unspecified hearing loss, bilateral: Secondary | ICD-10-CM

## 2021-07-28 DIAGNOSIS — R5382 Chronic fatigue, unspecified: Secondary | ICD-10-CM

## 2021-07-28 NOTE — Progress Notes (Signed)
[facility] Consult Note Telephone: [phone]  Fax: [phone]    Date of encounter: 07/28/21 1:36 PM PATIENT NAME: Marie Perkins [id number]   [phone] (home)  DOB: [birth year] MRN: [id number] PRIMARY CARE PROVIDER:    Alvester Morin, MD,  [address]. [place] [address] [phone]  REFERRING PROVIDER:   Alvester Morin, MD [address] [place],  [place] [address] [phone]  RESPONSIBLE PARTY:    Contact Information     Name Relation Home Work Mobile   Marie Perkins, Marie Perkins [phone]        I met face to face with patient  in [facility]. Palliative Care was asked to follow this patient by consultation request of  Slade-Hartman, Ivette Loyal* to address advance care planning and complex medical decision making. This is a follow up visit.                                   ASSESSMENT AND PLAN / RECOMMENDATIONS:   Advance Care Planning/Goals of Care: Goals include to maximize quality of life and symptom management.  Identification of a healthcare agent - son PHil CODE STATUS: DNR  Symptom Management/Plan:  Visit today to assess patient status. Staff reports she is more lethargic and not getting out of bed much.   Mobility Several months ago her baseline was spending the day in her recliner. On my exam today she does not rouse, appears asleep. Her intke is good at 75% + and vital signs remain stable. She appears comfortable, but has extreme HOH which impacts her ability to communicate.  Urinary: 07/22/21 was Dx with uti, being Rx with antibiotic therapy. Also has probiotic and seems to be tolerating this .  Follow up Palliative Care Visit: Palliative care will continue to follow for complex medical decision making, advance care planning, and clarification of goals. Return 2 weeks or prn.  This visit was coded based on medical  decision making (MDM).  PPS: 30%  HOSPICE ELIGIBILITY/DIAGNOSIS: TBD  Chief Complaint: debility  HISTORY OF PRESENT ILLNESS:  Marie Perkins is a 88 y.o. year old female  with HOH, OA, immobility, debility, recent UTI, recent covid infection.  History obtained from review of EMR, discussion with primary team, and interview with family, facility staff/caregiver and/or Marie Perkins.  I reviewed available labs, medications, imaging, studies and related documents from the EMR.  Records reviewed and summarized above.   ROS/staff   General: NAD ENMT: denies dysphagia Pulmonary: denies cough, denies increased SOB Abdomen: endorses good appetite, denies constipation, endorses continence of bowel GU: denies dysuria, endorses continence of urine MSK:  endorses  increased weakness,  no falls reported Skin: denies rashes or wounds Neurological: denies pain, denies insomnia Psych: Endorses flat mood Heme/lymph/immuno: denies bruises, abnormal bleeding  Physical Exam: Current and past weights:140 lbs, 5 lb loss in 5 months Constitutional: NAD General: frail appearing, WNWD EYES: anicteric sclera, lids intact, no discharge  ENMT: I very hard of hearing, oral mucous membranes moist, dentition intact Pulmonary: no increased work of breathing, no cough, room air Abdomen: intake 75-100%, no ascites GU: deferred MSK: mod  sarcopenia, moves all extremities, ambulatory with help Skin: warm and dry, no rashes or wounds noted on visible skin Neuro: + generalized weakness,  mild cognitive impairment Psych: non-anxious affect Hem/lymph/immuno: no widespread bruising  Thank you for  the opportunity to participate in the care of Ms. Gillott.  The palliative care team will continue to follow. Please call our office at [phone] if we can be of additional assistance.   Jason Coop, NP DNP, AGPCNP-BC  COVID-19 PATIENT SCREENING TOOL Asked and negative response unless otherwise noted:   Have  you had symptoms of covid, tested positive or been in contact with someone with symptoms/positive test in the past 5-10 days?

## 2021-07-31 ENCOUNTER — Other Ambulatory Visit: Payer: Self-pay

## 2021-07-31 ENCOUNTER — Emergency Department
Admission: EM | Admit: 2021-07-31 | Discharge: 2021-07-31 | Disposition: A | Discharge status: Home / Self Care | Payer: Medicare Other | Attending: Emergency Medicine | Admitting: Emergency Medicine

## 2021-07-31 ENCOUNTER — Emergency Department: Payer: Medicare Other

## 2021-07-31 DIAGNOSIS — I509 Heart failure, unspecified: Secondary | ICD-10-CM | POA: Insufficient documentation

## 2021-07-31 DIAGNOSIS — N3000 Acute cystitis without hematuria: Secondary | ICD-10-CM | POA: Insufficient documentation

## 2021-07-31 DIAGNOSIS — R531 Weakness: Secondary | ICD-10-CM

## 2021-07-31 DIAGNOSIS — R5383 Other fatigue: Secondary | ICD-10-CM | POA: Diagnosis not present

## 2021-07-31 DIAGNOSIS — I110 Hypertensive heart disease with heart failure: Secondary | ICD-10-CM | POA: Diagnosis not present

## 2021-07-31 DIAGNOSIS — Z20822 Contact with and (suspected) exposure to covid-19: Secondary | ICD-10-CM | POA: Insufficient documentation

## 2021-07-31 DIAGNOSIS — R0602 Shortness of breath: Secondary | ICD-10-CM | POA: Diagnosis present

## 2021-07-31 LAB — URINALYSIS, ROUTINE W REFLEX MICROSCOPIC
Bilirubin Urine: NEGATIVE
Glucose, UA: NEGATIVE mg/dL
Ketones, ur: NEGATIVE mg/dL
Nitrite: POSITIVE — AB
Protein, ur: 30 mg/dL — AB
Specific Gravity, Urine: 1.01 (ref 1.005–1.030)
WBC, UA: >50 WBC/hpf — ABNORMAL HIGH (ref 0–5)
pH: 5 (ref 5.0–8.0)

## 2021-07-31 LAB — CBC WITH DIFFERENTIAL/PLATELET
Abs Immature Granulocytes: 0.1 10*3/uL — ABNORMAL HIGH (ref 0.00–0.07)
Basophils Absolute: 0.1 10*3/uL (ref 0.0–0.1)
Basophils Relative: 1 %
Eosinophils Absolute: 0.1 10*3/uL (ref 0.0–0.5)
Eosinophils Relative: 1 %
HCT: 43.2 % (ref 36.0–46.0)
Hemoglobin: 14.1 g/dL (ref 12.0–15.0)
Immature Granulocytes: 1 %
Lymphocytes Relative: 15 %
Lymphs Abs: 1.5 10*3/uL (ref 0.7–4.0)
MCH: 29.9 pg (ref 26.0–34.0)
MCHC: 32.6 g/dL (ref 30.0–36.0)
MCV: 91.5 fL (ref 80.0–100.0)
Monocytes Absolute: 0.8 10*3/uL (ref 0.1–1.0)
Monocytes Relative: 8 %
Neutro Abs: 7.5 10*3/uL (ref 1.7–7.7)
Neutrophils Relative %: 74 %
Platelets: 202 10*3/uL (ref 150–400)
RBC: 4.72 MIL/uL (ref 3.87–5.11)
RDW: 15.3 % (ref 11.5–15.5)
WBC: 10 10*3/uL (ref 4.0–10.5)
nRBC: 0 % (ref 0.0–0.2)

## 2021-07-31 LAB — TROPONIN I (HIGH SENSITIVITY)
Troponin I (High Sensitivity): 27 ng/L — ABNORMAL HIGH (ref <18)
Troponin I (High Sensitivity): 26 ng/L — ABNORMAL HIGH (ref <18)

## 2021-07-31 LAB — COMPREHENSIVE METABOLIC PANEL
ALT: 16 U/L (ref 0–44)
AST: 35 U/L (ref 15–41)
Albumin: 3.5 g/dL (ref 3.5–5.0)
Alkaline Phosphatase: 80 U/L (ref 38–126)
Anion gap: 11 (ref 5–15)
BUN: 21 mg/dL (ref 8–23)
CO2: 24 mmol/L (ref 22–32)
Calcium: 10.1 mg/dL (ref 8.9–10.3)
Chloride: 99 mmol/L (ref 98–111)
Creatinine, Ser: 1.19 mg/dL — ABNORMAL HIGH (ref 0.44–1.00)
GFR, Estimated: 43 mL/min — ABNORMAL LOW (ref >60)
Glucose, Bld: 124 mg/dL — ABNORMAL HIGH (ref 70–99)
Potassium: 4.4 mmol/L (ref 3.5–5.1)
Sodium: 134 mmol/L — ABNORMAL LOW (ref 135–145)
Total Bilirubin: 0.6 mg/dL (ref 0.3–1.2)
Total Protein: 7.4 g/dL (ref 6.5–8.1)

## 2021-07-31 LAB — RESP PANEL BY RT-PCR (FLU A&B, COVID) ARPGX2
Influenza A by PCR: NEGATIVE
Influenza B by PCR: NEGATIVE
SARS Coronavirus 2 by RT PCR: NEGATIVE

## 2021-07-31 MED ORDER — SULFAMETHOXAZOLE-TRIMETHOPRIM 800-160 MG PO TABS
1.0000 | ORAL_TABLET | Freq: Once | ORAL | Status: AC
Start: 2021-07-31 — End: 2021-07-31
  Administered 2021-07-31: 1 via ORAL
  Filled 2021-07-31: qty 1

## 2021-07-31 MED ORDER — LACTATED RINGERS IV BOLUS
1000.0000 mL | Freq: Once | INTRAVENOUS | Status: AC
  Administered 2021-07-31: 1000 mL via INTRAVENOUS

## 2021-07-31 MED ORDER — SULFAMETHOXAZOLE-TRIMETHOPRIM 800-160 MG PO TABS: 1.0000 | ORAL_TABLET | Freq: Two times a day (BID) | ORAL | 0 refills | Status: AC

## 2021-07-31 NOTE — ED Triage Notes (Incomplete)
Pt via EMS from [facility].

## 2021-07-31 NOTE — ED Provider Notes (Signed)
[facility] Provider Note    Event Date/Time   First MD Initiated Contact with Patient 07/31/21 1500     (approximate)   History   Chief Complaint Shortness of Breath   HPI  Marie Perkins is a 88 y.o. female with past medical history of hypertension, hyperlipidemia, CHF, and intermittent confusion who presents to the ED for shortness of breath.  History is limited due to patient's intermittent confusion and she states she is not sure why she is here.  Daughter states that she received a phone call from patient's nursing facility earlier today due to concern that patient's blood pressure was low and her oxygen saturation was in the 70s.  Patient denies any shortness of breath or pain in her chest, daughter has not noticed any recent fever or cough.  Daughter states that patient is actually more alert than she has been recently, apparently has been gradually declining over the past couple of weeks in terms of her mental status and fatigue.  Patient does complain of general fatigue today, but daughter states this is not new for her.  She has been following with palliative care and recently diagnosed with UTI, completed treatment with Cipro 2 days ago.  Daughter does state that patient has had very poor appetite recently and does not drink much water.     Physical Exam   Triage Vital Signs: ED Triage Vitals  Enc Vitals Group     BP 07/31/21 1059 92/66     Pulse Rate 07/31/21 1059 84     Resp 07/31/21 1059 18     Temp 07/31/21 1059 (!) 97.5 F (36.4 C)     Temp Source 07/31/21 1059 Oral     SpO2 07/31/21 1059 97 %     Weight 07/31/21 1130 143 lb 4.8 oz (65 kg)     Height 07/31/21 1130 5' (1.524 m)     Head Circumference --      Peak Flow --      Pain Score 07/31/21 1130 0     Pain Loc --      Pain Edu? --      Excl. in [place]? --     Most recent vital signs: Vitals:   07/31/21 1700 07/31/21 1715  BP: 102/87   Pulse: 83 61  Resp: 19   Temp:    SpO2:  99%     Constitutional: Awake and alert, appropriately interactive. Eyes: Conjunctivae are normal. Head: Atraumatic. Nose: No congestion/rhinnorhea. Mouth/Throat: Mucous membranes are dry.  Cardiovascular: Normal rate, irregularly irregular rhythm. Grossly normal heart sounds.  2+ radial pulses bilaterally. Respiratory: Normal respiratory effort.  No retractions. Lungs CTAB. Gastrointestinal: Soft and nontender. No distention. Musculoskeletal: No lower extremity tenderness nor edema.  Neurologic:  Normal speech and language.  Patient is globally weak with no gross focal neurologic deficits appreciated.    ED Results / Procedures / Treatments   Labs (all labs ordered are listed, but only abnormal results are displayed) Labs Reviewed  COMPREHENSIVE METABOLIC PANEL - Abnormal; Notable for the following components:      Result Value   Sodium 134 (*)    Glucose, Bld 124 (*)    Creatinine, Ser 1.19 (*)    GFR, Estimated 43 (*)    All other components within normal limits  CBC WITH DIFFERENTIAL/PLATELET - Abnormal; Notable for the following components:   Abs Immature Granulocytes 0.10 (*)    All other components within normal limits  URINALYSIS, ROUTINE W  REFLEX MICROSCOPIC - Abnormal; Notable for the following components:   Hgb urine dipstick SMALL (*)    Protein, ur 30 (*)    Nitrite POSITIVE (*)    Leukocytes,Ua LARGE (*)    WBC, UA >50 (*)    Bacteria, UA MANY (*)    All other components within normal limits  TROPONIN I (HIGH SENSITIVITY) - Abnormal; Notable for the following components:   Troponin I (High Sensitivity) 27 (*)    All other components within normal limits  TROPONIN I (HIGH SENSITIVITY) - Abnormal; Notable for the following components:   Troponin I (High Sensitivity) 26 (*)    All other components within normal limits  RESP PANEL BY RT-PCR (FLU A&B, COVID) ARPGX2  URINE CULTURE     EKG  ED ECG REPORT I, Blake Divine, the attending physician,  personally viewed and interpreted this ECG.   Date: 07/31/2021  EKG Time: 11:20  Rate: 76  Rhythm: atrial fibrillation, occasional PVC  Axis: LAD  Intervals:none  ST&T Change: Anterolateral ST/T wave changes  RADIOLOGY Chest x-ray reviewed by me with no infiltrate, edema, or effusion.  PROCEDURES:  Critical Care performed: No  .1-3 Lead EKG Interpretation Performed by: Blake Divine, MD Authorized by: Blake Divine, MD     Interpretation: abnormal     ECG rate:  65-80   ECG rate assessment: normal     Rhythm: atrial fibrillation     Ectopy: PVCs     Conduction: normal     MEDICATIONS ORDERED IN ED: Medications  sulfamethoxazole-trimethoprim (BACTRIM DS) 800-160 MG per tablet 1 tablet (has no administration in time range)  lactated ringers bolus 1,000 mL (1,000 mLs Intravenous New Bag/Given 07/31/21 1546)     IMPRESSION / MDM / ASSESSMENT AND PLAN / ED COURSE  I reviewed the triage vital signs and the nursing notes.                              88 y.o. female with past medical history of hypertension, hyperlipidemia, and CHF who presents to the ED due to concern for difficulty breathing, low O2 sats, and hypotension at her nursing facility.  Differential diagnosis includes, but is not limited to, pneumonia, COPD, CHF exacerbation, viral syndrome, dehydration, electrolyte abnormality, ACS, arrhythmia.  Patient is chronically ill-appearing but in no acute distress, O2 sats noted to be greater than 95% on room air here in the ED although blood pressure remains borderline low.  Patient denies any pain in her chest or difficulty breathing at any point and I suspect low O2 sat readings at her nursing facility were in error due to her nail polish, readings now normal with pulse ox placed on right earlobe.  Chest x-ray is unremarkable and testing for COVID-19 and influenza is negative.  Patient noted to have atrial fibrillation on EKG, although daughter reports that she has a  history of this intermittently and rate is currently controlled.  CBC and BMP show mild elevation in creatinine with no anemia or leukocytosis.  Troponin is mildly elevated but this is likely due to mildly elevated creatinine and we will trend.  Plan to hydrate with IV fluids and attempt to obtain urine sample, but if remainder of work-up is unremarkable daughter is agreeable with discharge back to nursing facility for PCP and palliative care follow-up.  The patient is on the cardiac monitor to evaluate for evidence of arrhythmia and/or significant heart rate changes.  UA is concerning  for recurrent UTI, unfortunately recent culture data is not available in care everywhere.  Per nursing home paperwork, patient recently treated with IM Rocephin and Cipro.  She is appropriate for outpatient management given no signs of sepsis, is well-appearing on reassessment with no complaints.  We will treat with Bactrim and daughter counseled to have patient closely follow-up with her PCP or return to the ED for new worsening symptoms.  Patient and daughter agree with plan.      FINAL CLINICAL IMPRESSION(S) / ED DIAGNOSES   Final diagnoses:  Generalized weakness  Fatigue, unspecified type  Acute cystitis without hematuria     Rx / DC Orders   ED Discharge Orders          Ordered    sulfamethoxazole-trimethoprim (BACTRIM DS) 800-160 MG tablet  2 times daily        07/31/21 1751             Note:  This document was prepared using Dragon voice recognition software and may include unintentional dictation errors.   Blake Divine, MD 07/31/21 1753

## 2021-07-31 NOTE — ED Provider Triage Note (Signed)
°  Emergency Medicine Provider Triage Evaluation Note  Marie Perkins , a 88 y.o.female,  was evaluated in triage.  Pt complains of shortness of breath.  Patient was brought in by EMS.  Patient has substantial hearing loss, she is unable to give [place] a full picture on why she is here.  She is currently not endorsing any pain.  She says she feels well.   Review of Systems  Positive: Shortness of breath. Negative: Denies fever, chest pain, vomiting  Physical Exam   Vitals:   07/31/21 1059  BP: 92/66  Pulse: 84  Resp: 18  Temp: (!) 97.5 F (36.4 C)  SpO2: 97%   Gen:   Awake, no distress   Resp:  Normal effort  MSK:   Moves extremities without difficulty  Other:    Medical Decision Making  Given the patient's initial medical screening exam, the following diagnostic evaluation has been ordered. The patient will be placed in the appropriate treatment space, once one is available, to complete the evaluation and treatment. I have discussed the plan of care with the patient and I have advised the patient that an ED physician or mid-level practitioner will reevaluate their condition after the test results have been received, as the results may give them additional insight into the type of treatment they may need.    Diagnostics: Labs, x-ray, EKG, respiratory panel, UA  Treatments: O2   Varney Daily, [place] 07/31/21 1115

## 2021-07-31 NOTE — ED Notes (Signed)
CALLED EMS FOR TRANSPORT BACK TO [facility]

## 2021-07-31 NOTE — ED Notes (Signed)
Pt eating family at bedside.

## 2021-08-03 LAB — URINE CULTURE: Culture: >=100000 — AB

## 2021-08-04 NOTE — Progress Notes (Signed)
ED Antimicrobial Stewardship Positive Culture Follow Up   Marie Perkins is an 88 y.o. female who presented to [facility] on 07/31/2021 with a chief complaint of  Chief Complaint  Patient presents with   Shortness of Breath    Recent Results (from the past 720 hour(s))  Resp Panel by RT-PCR (Flu A&B, Covid) Nasopharyngeal Swab     Status: None   Collection Time: 07/31/21 11:19 AM   Specimen: Nasopharyngeal Swab; Nasopharyngeal(NP) swabs in vial transport medium  Result Value Ref Range Status   SARS Coronavirus 2 by RT PCR NEGATIVE NEGATIVE Final    Comment: (NOTE) SARS-CoV-2 target nucleic acids are NOT DETECTED.  The SARS-CoV-2 RNA is generally detectable in upper respiratory specimens during the acute phase of infection. The lowest concentration of SARS-CoV-2 viral copies this assay can detect is 138 copies/mL. A negative result does not preclude SARS-Cov-2 infection and should not be used as the sole basis for treatment or other patient management decisions. A negative result may occur with  improper specimen collection/handling, submission of specimen other than nasopharyngeal swab, presence of viral mutation(s) within the areas targeted by this assay, and inadequate number of viral copies(<138 copies/mL). A negative result must be combined with clinical observations, patient history, and epidemiological information. The expected result is Negative.  Fact Sheet for Patients:  BloggerCourse.com  Fact Sheet for Healthcare Providers:  SeriousBroker.it  This test is no t yet approved or cleared by the [place] FDA and  has been authorized for detection and/or diagnosis of SARS-CoV-2 by FDA under an Emergency Use Authorization (EUA). This EUA will remain  in effect (meaning this test can be used) for the duration of the COVID-19 declaration under Section 564(b)(1) of the Act, 21 U.S.C.section 360bbb-3(b)(1), unless the  authorization is terminated  or revoked sooner.       Influenza A by PCR NEGATIVE NEGATIVE Final   Influenza B by PCR NEGATIVE NEGATIVE Final    Comment: (NOTE) The Xpert Xpress SARS-CoV-2/FLU/RSV plus assay is intended as an aid in the diagnosis of influenza from Nasopharyngeal swab specimens and should not be used as a sole basis for treatment. Nasal washings and aspirates are unacceptable for Xpert Xpress SARS-CoV-2/FLU/RSV testing.  Fact Sheet for Patients: BloggerCourse.com  Fact Sheet for Healthcare Providers: SeriousBroker.it  This test is not yet approved or cleared by the [place] FDA and has been authorized for detection and/or diagnosis of SARS-CoV-2 by FDA under an Emergency Use Authorization (EUA). This EUA will remain in effect (meaning this test can be used) for the duration of the COVID-19 declaration under Section 564(b)(1) of the Act, 21 U.S.C. section 360bbb-3(b)(1), unless the authorization is terminated or revoked.  Performed at [facility], [address], [place], [place] [address]   Urine Culture     Status: Abnormal   Collection Time: 07/31/21  5:38 PM   Specimen: Urine, Random  Result Value Ref Range Status   Specimen Description   Final    URINE, RANDOM Performed at [facility], [address], [place], [place] [address]    Special Requests   Final    NONE Performed at [facility], [address] [place], [place] [address]    Culture (A)  Final    >=100,000 COLONIES/mL ESCHERICHIA COLI Confirmed Extended Spectrum Beta-Lactamase Producer (ESBL).  In bloodstream infections from ESBL organisms, carbapenems are preferred over piperacillin/tazobactam. They are shown to have a lower risk of mortality. Two isolates with different morphologies were identified  as the same organism.The most resistant organism was reported. Performed at [facility], [address] [place], [place] [address]    Report Status 08/03/2021 FINAL  Final   Organism ID, Bacteria ESCHERICHIA COLI (A)  Final      Susceptibility   Escherichia coli - MIC*    AMPICILLIN >=32 RESISTANT Resistant     CEFAZOLIN >=64 RESISTANT Resistant     CEFEPIME 2 SENSITIVE Sensitive     CEFTRIAXONE >=64 RESISTANT Resistant     CIPROFLOXACIN >=4 RESISTANT Resistant     GENTAMICIN <=1 SENSITIVE Sensitive     IMIPENEM <=0.25 SENSITIVE Sensitive     NITROFURANTOIN <=16 SENSITIVE Sensitive     TRIMETH/SULFA >=320 RESISTANT Resistant     AMPICILLIN/SULBACTAM 8 SENSITIVE Sensitive     PIP/TAZO <=4 SENSITIVE Sensitive     * >=100,000 COLONIES/mL ESCHERICHIA COLI    [x]  Treated with Bactrim, organism resistant to prescribed antimicrobial  New antibiotic prescription: Patient discharged back to SNF. After discussion with ED provider called SNF to communicate culture results. Faxed over culture results to SNF following conversation with RN  ED Provider: Dr. , PharmD, BCPS Clinical Pharmacist   08/04/2021, 4:10 PM Clinical Pharmacist Monday - Friday phone -  [phone] Saturday - Sunday phone - [phone]

## 2021-08-09 ENCOUNTER — Other Ambulatory Visit: Payer: Medicare Other | Admitting: Primary Care

## 2021-08-09 ENCOUNTER — Other Ambulatory Visit: Payer: Self-pay

## 2021-08-09 DIAGNOSIS — R5382 Chronic fatigue, unspecified: Secondary | ICD-10-CM

## 2021-08-09 DIAGNOSIS — H9193 Unspecified hearing loss, bilateral: Secondary | ICD-10-CM

## 2021-08-09 DIAGNOSIS — M1990 Unspecified osteoarthritis, unspecified site: Secondary | ICD-10-CM

## 2021-08-09 DIAGNOSIS — Z515 Encounter for palliative care: Secondary | ICD-10-CM

## 2021-08-09 DIAGNOSIS — R5381 Other malaise: Secondary | ICD-10-CM

## 2021-08-09 NOTE — Progress Notes (Signed)
[facility] Palliative Care Consult Note Telephone: [phone]  Fax: [phone]    Date of encounter: 08/09/21 4:28 PM PATIENT NAME: Marie Perkins [place] [address]   [phone] (home)  DOB: 1927/04/27 MRN: [id number] PRIMARY CARE PROVIDER:    Alvester Morin, MD,  Marie Perkins. Marie Perkins [place] [address] [phone]  REFERRING PROVIDER:   Alvester Morin, MD Marie Perkins. Marie Perkins,   [address] [phone]  RESPONSIBLE PARTY:    Contact Information     Name Relation Home Work Mobile   Marie, Perkins [phone]          I met face to face with patient and family in [facility] facility. Palliative Care was asked to follow this patient by consultation request of  Marie Perkins, Marie Perkins* to address advance care planning and complex medical decision making. This is a follow up visit.                                   ASSESSMENT AND PLAN / RECOMMENDATIONS:   Advance Care Planning/Goals of Care: Goals include to maximize quality of life and symptom management. Patient/health care surrogate gave his/her permission to discuss.Our advance care planning conversation included a discussion about:     Exploration of personal, cultural or spiritual beliefs that might influence medical decisions  Exploration of goals of care in the event of a sudden injury or illness CODE STATUS: DNR  Symptom Management/Plan:  I met patient in her nursing home room. I met with her daughter as well who was visiting. Daughter and patient or playing Uno. This is a much improved picture from my last visit. Staff endorses she is better and has periods of more wakefulness. Patient however endorses that she hasn't been dressed for the day yet and it was after lunch. She request being able to  dress daily of the back up to her chair when she can tolerate it. Patient was alert and oriented at her baseline. Family goals are for  supportive care and partner and quality of life.   UTI is being Rx with macrobid. Sensitivity is in and this is covered.  Follow up Palliative Care Visit: Palliative care will continue to follow for complex medical decision making, advance care planning, and clarification of goals. Return 4 weeks or prn.  This visit was coded based on medical decision making (MDM).  PPS: 40%  HOSPICE ELIGIBILITY/DIAGNOSIS: TBD  Chief Complaint: debility  HISTORY OF PRESENT ILLNESS:  Marie Perkins is a 88 y.o. year old female  with debility, immobility, covid infection with debility increased, HOH .   History obtained from review of EMR, discussion with primary team, and interview with family, facility staff/caregiver and/or Marie Perkins.  I reviewed available labs, medications, imaging, studies and related documents from the EMR.  Records reviewed and summarized above.   ROS  General: NAD EYES: denies vision changes ENMT: denies dysphagia Pulmonary: denies cough, denies increased SOB Abdomen: endorses good appetite, endorses  constipation, endorses continence of bowel GU:  endorses dysuria, endorses incontinence of urine MSK:  endorses  increased weakness,  no falls reported Skin: denies rashes, endorses sacral wound Neurological: denies pain, denies insomnia Psych: Endorses positive mood Heme/lymph/immuno: denies bruises, abnormal bleeding  Physical Exam: Current and past weights: 136 lbs Constitutional: NAD General: frail appearing, thin EYES: anicteric sclera, lids intact, no discharge  ENMT:  loss of hearing, oral mucous  membranes moist, dentition intact Pulmonary: no increased work of breathing, no cough, room air Abdomen: intake 100%, no ascites GU: deferred MSK: + sarcopenia, moves all extremities,  non ambulatory Skin: warm and dry, no rashes or wounds on visible skin, sacral pi reported Neuro:  no generalized weakness,  no cognitive impairment Psych: non-anxious affect, A and O x  3 Hem/lymph/immuno: no widespread bruising   Thank you for the opportunity to participate in the care of Marie Perkins.  The palliative care team will continue to follow. Please call our office at [phone] if we can be of additional assistance.   Marie Coop, NP DNP, AGPCNP-BC  COVID-19 PATIENT SCREENING TOOL Asked and negative response unless otherwise noted:   Have you had symptoms of covid, tested positive or been in contact with someone with symptoms/positive test in the past 5-10 days?

## 2021-09-04 ENCOUNTER — Other Ambulatory Visit: Payer: Self-pay

## 2021-09-04 ENCOUNTER — Inpatient Hospital Stay
Admission: EM | Admit: 2021-09-04 | Discharge: 2021-09-07 | DRG: 641 | Disposition: A | Discharge status: Skilled Nursing Facility | Payer: Medicare Other | Attending: Obstetrics and Gynecology | Admitting: Obstetrics and Gynecology

## 2021-09-04 ENCOUNTER — Emergency Department: Payer: Medicare Other

## 2021-09-04 DIAGNOSIS — Z8616 Personal history of COVID-19: Secondary | ICD-10-CM

## 2021-09-04 DIAGNOSIS — Z79899 Other long term (current) drug therapy: Secondary | ICD-10-CM

## 2021-09-04 DIAGNOSIS — E871 Hypo-osmolality and hyponatremia: Secondary | ICD-10-CM | POA: Diagnosis not present

## 2021-09-04 DIAGNOSIS — I4891 Unspecified atrial fibrillation: Secondary | ICD-10-CM

## 2021-09-04 DIAGNOSIS — I4821 Permanent atrial fibrillation: Secondary | ICD-10-CM | POA: Diagnosis present

## 2021-09-04 DIAGNOSIS — L8990 Pressure ulcer of unspecified site, unspecified stage: Secondary | ICD-10-CM | POA: Insufficient documentation

## 2021-09-04 DIAGNOSIS — L89322 Pressure ulcer of left buttock, stage 2: Secondary | ICD-10-CM | POA: Diagnosis present

## 2021-09-04 DIAGNOSIS — R8271 Bacteriuria: Secondary | ICD-10-CM | POA: Diagnosis present

## 2021-09-04 DIAGNOSIS — J189 Pneumonia, unspecified organism: Secondary | ICD-10-CM

## 2021-09-04 DIAGNOSIS — G8929 Other chronic pain: Secondary | ICD-10-CM | POA: Diagnosis present

## 2021-09-04 DIAGNOSIS — I099 Rheumatic heart disease, unspecified: Secondary | ICD-10-CM | POA: Diagnosis present

## 2021-09-04 DIAGNOSIS — R918 Other nonspecific abnormal finding of lung field: Secondary | ICD-10-CM | POA: Diagnosis present

## 2021-09-04 DIAGNOSIS — M549 Dorsalgia, unspecified: Secondary | ICD-10-CM | POA: Diagnosis present

## 2021-09-04 DIAGNOSIS — R531 Weakness: Secondary | ICD-10-CM

## 2021-09-04 DIAGNOSIS — M4800 Spinal stenosis, site unspecified: Secondary | ICD-10-CM | POA: Diagnosis present

## 2021-09-04 DIAGNOSIS — M810 Age-related osteoporosis without current pathological fracture: Secondary | ICD-10-CM | POA: Diagnosis present

## 2021-09-04 DIAGNOSIS — R079 Chest pain, unspecified: Secondary | ICD-10-CM | POA: Diagnosis present

## 2021-09-04 DIAGNOSIS — I10 Essential (primary) hypertension: Secondary | ICD-10-CM | POA: Diagnosis present

## 2021-09-04 DIAGNOSIS — Z66 Do not resuscitate: Secondary | ICD-10-CM | POA: Diagnosis present

## 2021-09-04 DIAGNOSIS — Z888 Allergy status to other drugs, medicaments and biological substances status: Secondary | ICD-10-CM

## 2021-09-04 HISTORY — DX: Unspecified atrial fibrillation: I48.91

## 2021-09-04 LAB — BASIC METABOLIC PANEL
Anion gap: 6 (ref 5–15)
BUN: 14 mg/dL (ref 8–23)
CO2: 24 mmol/L (ref 22–32)
Calcium: 9.3 mg/dL (ref 8.9–10.3)
Chloride: 95 mmol/L — ABNORMAL LOW (ref 98–111)
Creatinine, Ser: 0.7 mg/dL (ref 0.44–1.00)
GFR, Estimated: >60 mL/min (ref >60)
Glucose, Bld: 101 mg/dL — ABNORMAL HIGH (ref 70–99)
Potassium: 4.4 mmol/L (ref 3.5–5.1)
Sodium: 125 mmol/L — ABNORMAL LOW (ref 135–145)

## 2021-09-04 LAB — CBC
HCT: 33.8 % — ABNORMAL LOW (ref 36.0–46.0)
Hemoglobin: 11.1 g/dL — ABNORMAL LOW (ref 12.0–15.0)
MCH: 29.1 pg (ref 26.0–34.0)
MCHC: 32.8 g/dL (ref 30.0–36.0)
MCV: 88.7 fL (ref 80.0–100.0)
Platelets: 206 10*3/uL (ref 150–400)
RBC: 3.81 MIL/uL — ABNORMAL LOW (ref 3.87–5.11)
RDW: 15.4 % (ref 11.5–15.5)
WBC: 11.8 10*3/uL — ABNORMAL HIGH (ref 4.0–10.5)
nRBC: 0 % (ref 0.0–0.2)

## 2021-09-04 LAB — TROPONIN I (HIGH SENSITIVITY)
Troponin I (High Sensitivity): 36 ng/L — ABNORMAL HIGH (ref <18)
Troponin I (High Sensitivity): 33 ng/L — ABNORMAL HIGH (ref <18)

## 2021-09-04 MED ORDER — SODIUM CHLORIDE 0.9 % IV SOLN
2.0000 g | INTRAVENOUS | Status: DC
  Administered 2021-09-05: 2 g via INTRAVENOUS
  Filled 2021-09-04 (×2): qty 20

## 2021-09-04 MED ORDER — ACETAMINOPHEN 325 MG PO TABS: 650.0000 mg | ORAL_TABLET | Freq: Four times a day (QID) | ORAL | Status: DC | PRN

## 2021-09-04 MED ORDER — ONDANSETRON HCL 4 MG/2ML IJ SOLN: 4.0000 mg | Freq: Four times a day (QID) | INTRAMUSCULAR | Status: DC | PRN

## 2021-09-04 MED ORDER — SODIUM CHLORIDE 0.9 % IV SOLN: 500.0000 mg | INTRAVENOUS | Status: DC

## 2021-09-04 MED ORDER — ACETAMINOPHEN 650 MG RE SUPP: 650.0000 mg | Freq: Four times a day (QID) | RECTAL | Status: DC | PRN

## 2021-09-04 MED ORDER — ENOXAPARIN SODIUM 40 MG/0.4ML IJ SOSY
40.0000 mg | PREFILLED_SYRINGE | INTRAMUSCULAR | Status: DC
  Administered 2021-09-05 (×2): 40 mg via SUBCUTANEOUS
  Filled 2021-09-04 (×2): qty 0.4

## 2021-09-04 MED ORDER — SODIUM CHLORIDE 0.9 % IV SOLN
1.0000 g | Freq: Once | INTRAVENOUS | Status: AC
  Administered 2021-09-04: 1 g via INTRAVENOUS
  Filled 2021-09-04: qty 10

## 2021-09-04 MED ORDER — HYDROCODONE-ACETAMINOPHEN 5-325 MG PO TABS: 1.0000 | ORAL_TABLET | Freq: Four times a day (QID) | ORAL | Status: DC | PRN

## 2021-09-04 MED ORDER — SODIUM CHLORIDE 0.9 % IV SOLN: INTRAVENOUS | Status: DC

## 2021-09-04 MED ORDER — AZITHROMYCIN 500 MG IV SOLR
500.0000 mg | Freq: Once | INTRAVENOUS | Status: AC
  Administered 2021-09-04: 500 mg via INTRAVENOUS
  Filled 2021-09-04: qty 5

## 2021-09-04 MED ORDER — ONDANSETRON HCL 4 MG PO TABS: 4.0000 mg | ORAL_TABLET | Freq: Four times a day (QID) | ORAL | Status: DC | PRN

## 2021-09-04 NOTE — Assessment & Plan Note (Addendum)
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 Most recent sodium on record 134 ?Family does not want any additional diagnostic evaluation ?Will continue NS hydration in the setting of acute illness ?Monitor sodium ?

## 2021-09-04 NOTE — Assessment & Plan Note (Signed)
Related to acute illness superimposed on chronic deconditioning ?Patient is followed by palliative care ?

## 2021-09-04 NOTE — ED Provider Notes (Signed)
? ?[facility] ?Provider Note ? ? ? Event Date/Time  ? First MD Initiated Contact with Patient 09/04/21 1753   ?  (approximate) ? ?History  ? ?Chief Complaint: Chest pain ?HPI ? ?Marie Perkins is a 88 y.o. female with a past medical history of atrial fibrillation, coming from a nursing facility with chest pain.  According to the son and report patient was complaining of chest pain several hours ago around 2 or 3 PM.  They gave the patient a hydrocodone which helped the pain but she states the pain came back so they brought her to the emergency department for evaluation.  Patient denies any pain upon arrival.  Son is here with the patient who states she was complaining of a pressure sensation in the chest with some left arm radiation.  Denies any recent trauma.  States they did see rib fractures on a recent chest x-ray but were told at that time that the fractures were old and not new.  No reported cough or fever. ? ?Physical Exam  ? ?Triage Vital Signs: ?ED Triage Vitals  ?Enc Vitals Group  ?   BP 09/04/21 1733 118/78  ?   Pulse Rate 09/04/21 1733 96  ?   Resp 09/04/21 1733 20  ?   Temp 09/04/21 1733 98.3 ?F (36.8 ?C)  ?   Temp Source 09/04/21 1733 Axillary  ?   SpO2 09/04/21 1733 94 %  ?   Weight 09/04/21 1720 135 lb (61.2 kg)  ?   Height 09/04/21 1720 5\' 9"  (1.753 m)  ?   Head Circumference --   ?   Peak Flow --   ?   Pain Score 09/04/21 1719 0  ?   Pain Loc --   ?   Pain Edu? --   ?   Excl. in [place]? --   ? ? ?Most recent vital signs: ?Vitals:  ? 09/04/21 1733  ?BP: 118/78  ?Pulse: 96  ?Resp: 20  ?Temp: 98.3 ?F (36.8 ?C)  ?SpO2: 94%  ? ? ?General: Somnolent but awakens to voice.  Hard of hearing. ?CV:  Good peripheral perfusion.  Regular rate and rhythm  ?Resp:  Normal effort.  Equal breath sounds bilaterally.  ?Abd:  No distention.  Soft, nontender.  No rebound or guarding. ? ? ? ?ED Results / Procedures / Treatments  ? ?EKG ? ?EKG viewed and interpreted by myself shows what appears to be  atrial fibrillation at 89 bpm with a narrow QRS, left axis deviation, largely normal intervals with no concerning ST changes. ? ?RADIOLOGY ? ?I personally viewed the chest x-ray images appears to have some hilar opacities possibly lymph nodes.  No pneumothorax noted on my evaluation. ?Radiology is read the chest x-ray is new patchy opacity the left lung base atelectasis versus pneumonia. ? ? ?MEDICATIONS ORDERED IN ED: ?Medications - No data to display ? ? ?IMPRESSION / MDM / ASSESSMENT AND PLAN / ED COURSE  ?I reviewed the triage vital signs and the nursing notes. ? ?Patient presents to the emergency department for chest pain starting approximately 4 hours ago, now resolved.  Overall the patient appears well, reassuring vitals.  Patient is somnolent but awakens easily to voice will attempt answer questions.  She is hard of hearing.  Most of the history is from the son.  Patient has a DNR and the son states they want her to be comfortable but they do not want any aggressive or invasive treatments.  Son is agreeable  to medical work-up with labs and a chest x-ray as well as an EKG. ? ?Patient's labs have resulted showing moderate hyponatremia with a sodium of 125 as well as a new opacity in left lung base possibly representing pneumonia this is the area the patient was complaining of pain earlier.  Troponin slightly elevated at 33 however largely unchanged from historical values.  CBC is reassuring. ? ?I spoke to the patient's son, given the patient's hyponatremia with decrease in strength increased weakness and fatigue over the past week or so this could very well be related.  Son is agreeable to admission for IV fluids to increase sodium level.  Given the left lower lobe opacity on the chest x-ray does not wish to proceed with further imaging such as a CT scan to evaluate for cancer/tumor but is agreeable to antibiotics for presumed pneumonia as this is the location of the patient was complaining of pain earlier.   We will start the patient on IV antibiotics, continue on IV normal saline infusion and admit to the hospital service for further treatment. ? ?FINAL CLINICAL IMPRESSION(S) / ED DIAGNOSES  ? ?Chest pain ?Hyponatremia ?Pneumonia ? ?Note:  This document was prepared using Dragon voice recognition software and may include unintentional dictation errors. ?  Minna Antis, MD ?09/04/21 2021 ? ?

## 2021-09-04 NOTE — ED Triage Notes (Signed)
Pt comes ems [facility] with left sided chest pain. Pt was given hydrocodone and it went away but then came back again today. Pt's son states that she had a cxr recently that showed some rib fractures. HOH. VSS.  ?

## 2021-09-04 NOTE — ED Notes (Signed)
Pt in recliner in room, son at bedside. Pt has "chronic" L rib fx seen on recent xray (this wk).  ? ?Son states pt does not have dementia. Pt cried out in pain with BP reading.  ? ?Pt had c/o chest pain but once arrived to ED had resolved. Earlier at [employer], CP felt "heavy" and radiating to L arm. ?

## 2021-09-04 NOTE — Assessment & Plan Note (Signed)
Unlikely ACS given troponin trend 36-33 ?Likely related to pneumonia ?Tylenol for mild pain, hydrocodone for more severe ?Incentive spirometry ?

## 2021-09-04 NOTE — H&P (Signed)
?History and Physical  ? ? ?Patient: Marie Perkins ZOX:[id number] DOB: January 26, 1927 ?DOA: 09/04/2021 ?DOS: the patient was seen and examined on 09/04/2021 ?PCP: Keane Police, MD  ?Patient coming from: Home ? ?Chief Complaint: No chief complaint on file. ? ? ?HPI: [name] is a 88 y.o. female with medical history significant for Atrial fibrillation not on medication, generalized weakness, DNR, followed by [facility] palliative care who was brought to the ED for evaluation of left-sided chest pain that started a few hours prior to presentation, unrelieved with hydrocodone at home.  Patient describes pain as a pressure that radiates to the left arm..  Denies recent trauma.  Had no associated nausea, vomiting, shortness of breath, lightheadedness or palpitations.  Had a recent chest x-ray that showed old fractures. denies recent cough, fever or chills. ?ED course: Vitals within normal limits. ?Blood work: Troponin 36-33.  Sodium 125, WBC 11.8, hemoglobin 11.1.  Blood work otherwise unremarkable ?EKG, personally viewed and interpreted: A-fib at 89 with no acute ST-T wave changes. ?Chest x-ray with new patchy opacity left lung base atelectasis versus pneumonia. ?The ED provider discussed further imaging with patient and son in view of hyponatremia to evaluate for underlying mass.  Son declined given that even if there were mass patient would like no further intervention.  He was agreeable to hospitalization for antibiotics.  Patient was started on Rocephin and azithromycin as well as a normal saline infusion.Marland Kitchen  Hospitalist consulted for admission  ? ?Review of Systems: As mentioned in the history of present illness. All other systems reviewed and are negative. ?Past Medical History:  ?Diagnosis Date  ? A-fib (HCC)   ? Asteatosis cutis   ? Heart failure, unspecified (HCC)   ? Senile osteoporosis   ? Simple constipation   ? Syndrome of inappropriate vasopressin secretion (HCC)   ? ?History reviewed. No pertinent  surgical history. ?Social History:  reports that she has never smoked. She has never used smokeless tobacco. She reports that she does not drink alcohol and does not use drugs. ? ?Allergies  ?Allergen Reactions  ? Crestor [Rosuvastatin Calcium]   ? Pravastatin   ? Simvastatin   ? ? ?History reviewed. No pertinent family history. ? ?Prior to Admission medications   ?Medication Sig Start Date End Date Taking? Authorizing Provider  ?acetaminophen (TYLENOL) 650 MG CR tablet Take 650 mg by mouth in the morning and at bedtime. Not to exceed 3 gm / day    [provider]  ?benazepril (LOTENSIN) 10 MG tablet Take 10 mg by mouth 2 (two) times daily. Hold for Sbp<110    [provider]  ?bisacodyl (DULCOLAX) 5 MG EC tablet Take 5 mg by mouth daily.    [provider]  ?carvedilol (COREG) 3.125 MG tablet Take 3.125 mg by mouth every 12 (twelve) hours. Hold for SBP < 110    [provider]  ?cholecalciferol (VITAMIN D3) 25 MCG (1000 UNIT) tablet Take 2,000 Units by mouth daily.    [provider]  ?furosemide (LASIX) 20 MG tablet Take 20 mg by mouth daily.    [provider]  ?gabapentin (NEURONTIN) 100 MG capsule Take 100 mg by mouth at bedtime.    [provider]  ?HYDROcodone-acetaminophen (NORCO/VICODIN) 5-325 MG tablet Take 0.5 tablets by mouth at bedtime. And 1 tab q 6 hr prn Mild pain    [provider]  ?ipratropium-albuterol (DUONEB) 0.5-2.5 (3) MG/3ML SOLN Take 3 mLs by nebulization every 6 (six) hours as needed.  [provider]  ?Lactobacillus (ACIDOPHILUS) TABS Take 1 tablet by mouth in the morning and at bedtime. ?Patient not taking: Reported on 08/09/2021 07/22/21   [provider]  ?Lidocaine 4 % PTCH Apply 1 patch topically every 12 (twelve) hours.    [provider]  ?Multiple Vitamin (MULTIVITAMIN) tablet Take 1 tablet by mouth daily.    [provider]  ?nitrofurantoin, macrocrystal-monohydrate,  (MACROBID) 100 MG capsule Take 100 mg by mouth 2 (two) times daily. 08/06/21   [provider]  ?Omega-3 Fatty Acids (EQL OMEGA 3 FISH OIL) 1000 MG CAPS Take 1 capsule by mouth every other day.    [provider]  ?polyethylene glycol (MIRALAX / GLYCOLAX) 17 g packet Take 17 g by mouth daily.     [provider]  ?polyvinyl alcohol (LIQUIFILM TEARS) 1.4 % ophthalmic solution 1 drop at bedtime.    [provider]  ? ? ?Physical Exam: ?Vitals:  ? 09/04/21 1720 09/04/21 1733 09/04/21 1800 09/04/21 1943  ?BP:  118/78 111/74 122/88  ?Pulse:  96 80 79  ?Resp:  20 18 20   ?Temp:  98.3 ?F (36.8 ?C)  98.4 ?F (36.9 ?C)  ?TempSrc:  Axillary  Oral  ?SpO2:  94% 93% 94%  ?Weight: 61.2 kg     ?Height: 5\' 9"  (1.753 m)     ? ?Physical Exam ?Vitals and nursing note reviewed.  ?Constitutional:   ?   General: She is sleeping. She is not in acute distress. ?   Appearance: She is ill-appearing.  ?HENT:  ?   Head: Normocephalic and atraumatic.  ?Cardiovascular:  ?   Rate and Rhythm: Normal rate and regular rhythm.  ?   Pulses: Normal pulses.  ?   Heart sounds: Normal heart sounds. No murmur heard. ?Pulmonary:  ?   Effort: Pulmonary effort is normal.  ?   Breath sounds: Normal breath sounds. No wheezing or rhonchi.  ?Abdominal:  ?   General: Bowel sounds are normal.  ?   Palpations: Abdomen is soft.  ?   Tenderness: There is no abdominal tenderness.  ?Musculoskeletal:     ?   General: No swelling or tenderness. Normal range of motion.  ?   Cervical back: Normal range of motion and neck supple.  ?Skin: ?   General: Skin is warm and dry.  ?Neurological:  ?   General: No focal deficit present.  ?   Mental Status: She is easily aroused. She is lethargic and disoriented.  ?Psychiatric:     ?   Mood and Affect: Mood normal.     ?   Behavior: Behavior normal.  ? ? ? ?Data Reviewed: ?Relevant notes from primary care and specialist visits, past discharge summaries as available in EHR, including Care  Everywhere. ?Prior diagnostic testing as pertinent to current admission diagnoses ?Updated medications and problem lists for reconciliation ?ED course, including vitals, labs, imaging, treatment and response to treatment ?Triage notes, nursing and pharmacy notes and ED provider's notes ?Notable results as noted in HPI ? ? ?Assessment and Plan: ?* LLL pneumonia ?Rocephin and azithromycin ?DuoNebs as needed ? ? ?Left-sided chest pain ?Unlikely ACS given troponin trend 36-33 ?Likely related to pneumonia ?Tylenol for mild pain, hydrocodone for more severe ?Incentive spirometry ? 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 Most recent sodium on record 134 ?Family does not want any additional diagnostic evaluation ?Will continue NS hydration in the setting of acute  illness ?Monitor sodium ? ?Essential hypertension ?Continue benazepril and carvedilol ? ?Rheumatic heart disease ?Last echo 2016 with mild MR and grade 1 DD ? ?A-fib (HCC) ?Continue Coreg ?Not currently on systemic anticoagulation ? ?Generalized weakness ?Related to acute illness superimposed on chronic deconditioning ?Patient is followed by palliative care ? ?Spinal stenosis ?On hydrocodone as needed ? ? ? ? ? ? ?Advance Care Planning:   Code Status: DNR  ? ?Consults: none ? ?Family Communication: son at bedside ? ?Severity of Illness: ?The appropriate patient status for this patient is OBSERVATION. Observation status is judged to be reasonable and necessary in order to provide the required intensity of service to ensure the patient's safety. The patient's presenting symptoms, physical exam findings, and initial radiographic and laboratory data in the context of their medical condition is felt to place them at decreased risk for further clinical deterioration. Furthermore, it is anticipated that the patient will be medically stable for discharge from the hospital  within 2 midnights of admission.  ? ?Author: ?Andris Baumann, MD ?09/04/2021 8:52 PM ? ?For on call review [email or web address].  ?

## 2021-09-04 NOTE — Assessment & Plan Note (Signed)
Continue Coreg ?Not currently on systemic anticoagulation ?

## 2021-09-04 NOTE — ED Notes (Signed)
Pt is confused, hard of hearing, son is at bedside. NAD noted, pt denies pain. Pt in recliner upon assumption of care- states she prefers to bed.  ?

## 2021-09-04 NOTE — Assessment & Plan Note (Signed)
Last echo 2016 with mild MR and grade 1 DD ?

## 2021-09-04 NOTE — Assessment & Plan Note (Signed)
Rocephin and azithromycin ?DuoNebs as needed ? ?

## 2021-09-04 NOTE — Assessment & Plan Note (Signed)
On hydrocodone as needed ?

## 2021-09-04 NOTE — Assessment & Plan Note (Signed)
-   Continue benazepril and carvedilol ?

## 2021-09-05 ENCOUNTER — Inpatient Hospital Stay: Payer: Medicare Other

## 2021-09-05 DIAGNOSIS — R079 Chest pain, unspecified: Secondary | ICD-10-CM | POA: Diagnosis present

## 2021-09-05 DIAGNOSIS — G8929 Other chronic pain: Secondary | ICD-10-CM | POA: Diagnosis present

## 2021-09-05 DIAGNOSIS — L89322 Pressure ulcer of left buttock, stage 2: Secondary | ICD-10-CM | POA: Diagnosis present

## 2021-09-05 DIAGNOSIS — I099 Rheumatic heart disease, unspecified: Secondary | ICD-10-CM | POA: Diagnosis present

## 2021-09-05 DIAGNOSIS — M810 Age-related osteoporosis without current pathological fracture: Secondary | ICD-10-CM | POA: Diagnosis present

## 2021-09-05 DIAGNOSIS — Z8616 Personal history of COVID-19: Secondary | ICD-10-CM | POA: Diagnosis not present

## 2021-09-05 DIAGNOSIS — Z888 Allergy status to other drugs, medicaments and biological substances status: Secondary | ICD-10-CM | POA: Diagnosis not present

## 2021-09-05 DIAGNOSIS — M549 Dorsalgia, unspecified: Secondary | ICD-10-CM | POA: Diagnosis present

## 2021-09-05 DIAGNOSIS — R918 Other nonspecific abnormal finding of lung field: Secondary | ICD-10-CM | POA: Diagnosis present

## 2021-09-05 DIAGNOSIS — I10 Essential (primary) hypertension: Secondary | ICD-10-CM | POA: Diagnosis present

## 2021-09-05 DIAGNOSIS — J189 Pneumonia, unspecified organism: Secondary | ICD-10-CM | POA: Diagnosis not present

## 2021-09-05 DIAGNOSIS — Z79899 Other long term (current) drug therapy: Secondary | ICD-10-CM | POA: Diagnosis not present

## 2021-09-05 DIAGNOSIS — Z66 Do not resuscitate: Secondary | ICD-10-CM | POA: Diagnosis present

## 2021-09-05 DIAGNOSIS — I4821 Permanent atrial fibrillation: Secondary | ICD-10-CM | POA: Diagnosis present

## 2021-09-05 DIAGNOSIS — R8271 Bacteriuria: Secondary | ICD-10-CM | POA: Diagnosis present

## 2021-09-05 DIAGNOSIS — M4800 Spinal stenosis, site unspecified: Secondary | ICD-10-CM | POA: Diagnosis present

## 2021-09-05 DIAGNOSIS — E871 Hypo-osmolality and hyponatremia: Secondary | ICD-10-CM | POA: Diagnosis present

## 2021-09-05 LAB — BASIC METABOLIC PANEL
Anion gap: 6 (ref 5–15)
BUN: 12 mg/dL (ref 8–23)
CO2: 23 mmol/L (ref 22–32)
Calcium: 8.9 mg/dL (ref 8.9–10.3)
Chloride: 99 mmol/L (ref 98–111)
Creatinine, Ser: 0.63 mg/dL (ref 0.44–1.00)
GFR, Estimated: >60 mL/min (ref >60)
Glucose, Bld: 89 mg/dL (ref 70–99)
Potassium: 4.7 mmol/L (ref 3.5–5.1)
Sodium: 128 mmol/L — ABNORMAL LOW (ref 135–145)

## 2021-09-05 LAB — SODIUM, URINE, RANDOM: Sodium, Ur: 31 mmol/L

## 2021-09-05 LAB — URINALYSIS, ROUTINE W REFLEX MICROSCOPIC
Bilirubin Urine: NEGATIVE
Glucose, UA: NEGATIVE mg/dL
Ketones, ur: 20 mg/dL — AB
Nitrite: POSITIVE — AB
Protein, ur: 30 mg/dL — AB
Specific Gravity, Urine: 1.011 (ref 1.005–1.030)
WBC, UA: >50 WBC/hpf — ABNORMAL HIGH (ref 0–5)
pH: 6 (ref 5.0–8.0)

## 2021-09-05 LAB — OSMOLALITY, URINE: Osmolality, Ur: 317 mOsm/kg (ref 300–900)

## 2021-09-05 LAB — MRSA NEXT GEN BY PCR, NASAL: MRSA by PCR Next Gen: DETECTED — AB

## 2021-09-05 LAB — PROCALCITONIN: Procalcitonin: 0.1 ng/mL

## 2021-09-05 LAB — TSH: TSH: 0.926 u[IU]/mL (ref 0.350–4.500)

## 2021-09-05 LAB — RESP PANEL BY RT-PCR (FLU A&B, COVID) ARPGX2
Influenza A by PCR: NEGATIVE
Influenza B by PCR: NEGATIVE
SARS Coronavirus 2 by RT PCR: NEGATIVE

## 2021-09-05 LAB — OSMOLALITY: Osmolality: 278 mOsm/kg (ref 275–295)

## 2021-09-05 MED ORDER — MUPIROCIN 2 % EX OINT
1.0000 "application " | TOPICAL_OINTMENT | Freq: Two times a day (BID) | CUTANEOUS | Status: DC
  Administered 2021-09-06 – 2021-09-07 (×2): 1 via NASAL
  Filled 2021-09-05: qty 22

## 2021-09-05 MED ORDER — CHLORHEXIDINE GLUCONATE CLOTH 2 % EX PADS
6.0000 | MEDICATED_PAD | Freq: Every day | CUTANEOUS | Status: DC
Start: 2021-09-06 — End: 2021-09-07

## 2021-09-05 MED ORDER — HYDROCODONE-ACETAMINOPHEN 5-325 MG PO TABS
0.5000 | ORAL_TABLET | Freq: Every day | ORAL | Status: DC
  Filled 2021-09-05 (×2): qty 1

## 2021-09-05 MED ORDER — HALOPERIDOL LACTATE 5 MG/ML IJ SOLN
0.5000 mg | Freq: Four times a day (QID) | INTRAMUSCULAR | Status: DC | PRN
  Administered 2021-09-06: 0.5 mg via INTRAVENOUS
  Filled 2021-09-05: qty 1

## 2021-09-05 MED ORDER — GABAPENTIN 100 MG PO CAPS
100.0000 mg | ORAL_CAPSULE | Freq: Every day | ORAL | Status: DC
Start: 2021-09-05 — End: 2021-09-07
  Filled 2021-09-05 (×2): qty 1

## 2021-09-05 MED ORDER — SODIUM CHLORIDE 0.9 % IV SOLN
500.0000 mg | INTRAVENOUS | Status: DC
  Administered 2021-09-05: 500 mg via INTRAVENOUS
  Filled 2021-09-05 (×2): qty 5

## 2021-09-05 MED ORDER — CARVEDILOL 3.125 MG PO TABS
3.1250 mg | ORAL_TABLET | Freq: Two times a day (BID) | ORAL | Status: DC
  Administered 2021-09-06 – 2021-09-07 (×2): 3.125 mg via ORAL
  Filled 2021-09-05 (×4): qty 1

## 2021-09-05 NOTE — ED Notes (Signed)
Pt laid on a recliner all night. Offered pt hospital bed. Per son, pt is usually in a bed and doe not get up at all. States facility uses a lift to get her oob. ?

## 2021-09-05 NOTE — Plan of Care (Signed)
?  Problem: Activity: ?Goal: Ability to tolerate increased activity will improve ?09/05/2021 1744 by Leonie Douglas, RN ?Outcome: Progressing ?09/05/2021 1743 by Leonie Douglas, RN ?Outcome: Progressing ?  ?Problem: Clinical Measurements: ?Goal: Ability to maintain a body temperature in the normal range will improve ?09/05/2021 1744 by Leonie Douglas, RN ?Outcome: Progressing ?09/05/2021 1743 by Leonie Douglas, RN ?Outcome: Progressing ?  ?Problem: Respiratory: ?Goal: Ability to maintain adequate ventilation will improve ?09/05/2021 1744 by Leonie Douglas, RN ?Outcome: Progressing ?09/05/2021 1743 by Leonie Douglas, RN ?Outcome: Progressing ?Goal: Ability to maintain a clear airway will improve ?09/05/2021 1744 by Leonie Douglas, RN ?Outcome: Progressing ?09/05/2021 1743 by Leonie Douglas, RN ?Outcome: Progressing ?  ?Problem: Respiratory: ?Goal: Ability to maintain a clear airway will improve ?09/05/2021 1744 by Leonie Douglas, RN ?Outcome: Progressing ?09/05/2021 1743 by Leonie Douglas, RN ?Outcome: Progressing ?  ?Problem: Health Behavior/Discharge Planning: ?Goal: Ability to manage health-related needs will improve ?Outcome: Progressing ?  ?

## 2021-09-05 NOTE — Progress Notes (Signed)
PROGRESS NOTE    Marie SonSarah W. Perkins  ZOX:[id number] DOB: [birth year] DOA: 09/04/2021 PCP: Keane PoliceSlade-Hartman, Venezela, MD     Brief Narrative:   From admission h and p Marie Perkins is a 88 y.o. female with medical history significant for Atrial fibrillation not on medication, generalized weakness, DNR, followed by [facility] palliative care who was brought to the ED for evaluation of left-sided chest pain that started a few hours prior to presentation, unrelieved with hydrocodone at home.  Patient describes pain as a pressure that radiates to the left arm..  Denies recent trauma.  Had no associated nausea, vomiting, shortness of breath, lightheadedness or palpitations.  Had a recent chest x-ray that showed old fractures. denies recent cough, fever or chills.   Assessment & Plan:   Principal Problem:   LLL pneumonia Active Problems:   Spinal stenosis   Generalized weakness   A-fib (HCC)   Hyponatremia   Left-sided chest pain   Rheumatic heart disease   Essential hypertension   Chest pain  # Chest pain Resolved. No signs ACS on EKG. Troponin mildly elevated to 36, was 33 on repeat, has hx of mild similar tropinemia, do not think ACS  # CAP, possible CXR with possible infiltrate. A bit altered from baseline, son reports low-grade temp earlier in the week, no convincing story of cough, sob, etc. Covid/flu neg, did have covid in December. - cont ceftriaxone/azithromycin for now - f/u ct chest, procal - f/u blood cultures  # Recent UTI Son says urinalysis grossly positive last week at [facility], started on IM ceftriaxone, thinks culture was sent - urinalysis ordered here, with culture - may need to f/u [facility] culture results  # HTN Here bp wnl - cont home carvedilol  # Hyponatremia Na 125, was 134 a month ago. Improved to 128 with fluids - f/u urine osm and sodium, serum osm, tsh - hold lasix - f/u ct results - cont fluids for now at 100  # Hx a fib Not anticoagulated. Rate  controlled - cont coreg  # Chronic back pain - home gabapentin, norco ordered  DVT prophylaxis: lovenox Code Status: DNR Family Communication: son updated @ bedside 3/5  Level of care: Telemetry Medical Status is: Observation The patient will require care spanning > 2 midnights and should be moved to inpatient because: need for ongoing w/u, iv abx          Consultants:  none  Procedures: none  Antimicrobials:  Cef/azith    Subjective: Hard of hearing. Chest pain resolved. Denies cough or fever. No abd pain  Objective: Vitals:   09/05/21 0500 09/05/21 0600 09/05/21 0740 09/05/21 0749  BP: 116/80 116/81    Pulse: 97 85    Resp: 20 18    Temp:   (!) 97.5 F (36.4 C) (!) 97.5 F (36.4 C)  TempSrc:   Oral Oral  SpO2: 94% 97%    Weight:      Height:       No intake or output data in the 24 hours ending 09/05/21 0910 Filed Weights   09/04/21 1720  Weight: 61.2 kg    Examination:  General exam: Appears calm and comfortable  Respiratory system: Clear to auscultation save for basilar rales Cardiovascular system: S1 & S2 heard, RRR. Soft systolic murmur Gastrointestinal system: Abdomen is nondistended, soft and nontender. No organomegaly or masses felt. Normal bowel sounds heard. Central nervous system: hard of hearing. Moves all 4 Extremities: Symmetric 5 x 5 power. Skin: No rashes, lesions  or ulcers Psychiatry: simple responses    Data Reviewed: I have personally reviewed following labs and imaging studies  CBC: Recent Labs  Lab 09/04/21 1759  WBC 11.8*  HGB 11.1*  HCT 33.8*  MCV 88.7  PLT 206   Basic Metabolic Panel: Recent Labs  Lab 09/04/21 1759 09/05/21 0440  NA 125* 128*  K 4.4 4.7  CL 95* 99  CO2 24 23  GLUCOSE 101* 89  BUN 14 12  CREATININE 0.70 0.63  CALCIUM 9.3 8.9   GFR: Estimated Creatinine Clearance: 43.4 mL/min (by C-G formula based on SCr of 0.63 mg/dL). Liver Function Tests: No results for input(s): AST, ALT,  ALKPHOS, BILITOT, PROT, ALBUMIN in the last 168 hours. No results for input(s): LIPASE, AMYLASE in the last 168 hours. No results for input(s): AMMONIA in the last 168 hours. Coagulation Profile: No results for input(s): INR, PROTIME in the last 168 hours. Cardiac Enzymes: No results for input(s): CKTOTAL, CKMB, CKMBINDEX, TROPONINI in the last 168 hours. BNP (last 3 results) No results for input(s): PROBNP in the last 8760 hours. HbA1C: No results for input(s): HGBA1C in the last 72 hours. CBG: No results for input(s): GLUCAP in the last 168 hours. Lipid Profile: No results for input(s): CHOL, HDL, LDLCALC, TRIG, CHOLHDL, LDLDIRECT in the last 72 hours. Thyroid Function Tests: No results for input(s): TSH, T4TOTAL, FREET4, T3FREE, THYROIDAB in the last 72 hours. Anemia Panel: No results for input(s): VITAMINB12, FOLATE, FERRITIN, TIBC, IRON, RETICCTPCT in the last 72 hours. Urine analysis:    Component Value Date/Time   COLORURINE YELLOW 07/31/2021 1715   APPEARANCEUR CLEAR 07/31/2021 1715   LABSPEC 1.010 07/31/2021 1715   PHURINE 5.0 07/31/2021 1715   GLUCOSEU NEGATIVE 07/31/2021 1715   HGBUR SMALL (A) 07/31/2021 1715   BILIRUBINUR NEGATIVE 07/31/2021 1715   KETONESUR NEGATIVE 07/31/2021 1715   PROTEINUR 30 (A) 07/31/2021 1715   NITRITE POSITIVE (A) 07/31/2021 1715   LEUKOCYTESUR LARGE (A) 07/31/2021 1715   Sepsis Labs: @LABRCNTIP (procalcitonin:4,lacticidven:4)  ) Recent Results (from the past 240 hour(s))  Blood culture (routine x 2)     Status: None (Preliminary result)   Collection Time: 09/04/21 10:12 PM   Specimen: BLOOD LEFT HAND  Result Value Ref Range Status   Specimen Description BLOOD LEFT HAND  Final   Special Requests   Final    BOTTLES DRAWN AEROBIC AND ANAEROBIC Blood Culture results may not be optimal due to an inadequate volume of blood received in culture bottles   Culture   Final    NO GROWTH < 12 HOURS Performed at [facility], [address], [place], [place] [address]    Report Status PENDING  Incomplete  Blood culture (routine x 2)     Status: None (Preliminary result)   Collection Time: 09/04/21 10:12 PM   Specimen: BLOOD RIGHT HAND  Result Value Ref Range Status   Specimen Description BLOOD RIGHT HAND  Final   Special Requests   Final    BOTTLES DRAWN AEROBIC AND ANAEROBIC Blood Culture results may not be optimal due to an inadequate volume of blood received in culture bottles   Culture   Final    NO GROWTH < 12 HOURS Performed at [facility], [address] [place], [place] [address]    Report Status PENDING  Incomplete  Resp Panel by RT-PCR (Flu A&B, Covid) Nasopharyngeal Swab     Status: None   Collection Time: 09/05/21  7:43 AM   Specimen: Nasopharyngeal Swab; Nasopharyngeal(NP)  swabs in vial transport medium  Result Value Ref Range Status   SARS Coronavirus 2 by RT PCR NEGATIVE NEGATIVE Final    Comment: (NOTE) SARS-CoV-2 target nucleic acids are NOT DETECTED.  The SARS-CoV-2 RNA is generally detectable in upper respiratory specimens during the acute phase of infection. The lowest concentration of SARS-CoV-2 viral copies this assay can detect is 138 copies/mL. A negative result does not preclude SARS-Cov-2 infection and should not be used as the sole basis for treatment or other patient management decisions. A negative result may occur with  improper specimen collection/handling, submission of specimen other than nasopharyngeal swab, presence of viral mutation(s) within the areas targeted by this assay, and inadequate number of viral copies(<138 copies/mL). A negative result must be combined with clinical observations, patient history, and epidemiological information. The expected result is Negative.  Fact Sheet for Patients:  BloggerCourse.com  Fact Sheet for Healthcare Providers:  SeriousBroker.it  This test is no t yet approved  or cleared by the [place] FDA and  has been authorized for detection and/or diagnosis of SARS-CoV-2 by FDA under an Emergency Use Authorization (EUA). This EUA will remain  in effect (meaning this test can be used) for the duration of the COVID-19 declaration under Section 564(b)(1) of the Act, 21 U.S.C.section 360bbb-3(b)(1), unless the authorization is terminated  or revoked sooner.       Influenza A by PCR NEGATIVE NEGATIVE Final   Influenza B by PCR NEGATIVE NEGATIVE Final    Comment: (NOTE) The Xpert Xpress SARS-CoV-2/FLU/RSV plus assay is intended as an aid in the diagnosis of influenza from Nasopharyngeal swab specimens and should not be used as a sole basis for treatment. Nasal washings and aspirates are unacceptable for Xpert Xpress SARS-CoV-2/FLU/RSV testing.  Fact Sheet for Patients: BloggerCourse.com  Fact Sheet for Healthcare Providers: SeriousBroker.it  This test is not yet approved or cleared by the [place] FDA and has been authorized for detection and/or diagnosis of SARS-CoV-2 by FDA under an Emergency Use Authorization (EUA). This EUA will remain in effect (meaning this test can be used) for the duration of the COVID-19 declaration under Section 564(b)(1) of the Act, 21 U.S.C. section 360bbb-3(b)(1), unless the authorization is terminated or revoked.  Performed at [facility], [address] [place], [place] [address]          Radiology Studies: DG Chest Portable 1 View  Result Date: 09/04/2021 CLINICAL DATA:  Chest pain. EXAM: PORTABLE CHEST 1 VIEW COMPARISON:  Frontal and lateral views 07/31/2021 FINDINGS: Patient's chin obscures the left greater than right lung apex. Stable heart size with mild cardiomegaly. Unchanged mediastinal contours. Streaky right basilar atelectasis or scarring. There is new patchy opacity at the left lung base. No pulmonary edema. No large pleural  effusion. No pneumothorax, allowing for limitations related to positioning. Chronic changes of the shoulders. Thoracic compression fractures not well visualized in the absence of a lateral view IMPRESSION: 1. New patchy opacity at the left lung base, atelectasis versus pneumonia. 2. Streaky right basilar atelectasis or scarring. Electronically Signed   By: Narda Rutherford M.D.   On: 09/04/2021 18:18        Scheduled Meds:  carvedilol  3.125 mg Oral Q12H   enoxaparin (LOVENOX) injection  40 mg Subcutaneous Q24H   gabapentin  100 mg Oral QHS   HYDROcodone-acetaminophen  0.5 tablet Oral QHS   Continuous Infusions:  sodium chloride 100 mL/hr at 09/05/21 1308   azithromycin     cefTRIAXone (ROCEPHIN)  IV  LOS: 0 days    Time spent: 58    Silvano Bilis, MD [facility]   If 7PM-7AM, please contact night-coverage [email or web address] Password [email or web address] 09/05/2021, 9:10 AM

## 2021-09-05 NOTE — ED Notes (Signed)
Pt was placed on a hospital bed. Inc care performed. New brief and purewick applied. Pt is on a hospital gown. Pt is turned to left side w/ pillow underneath.  ?

## 2021-09-05 NOTE — ED Notes (Signed)
FULL RAINBOW SENT TO LAB (INCLUDING BLUE AND GRAY TOP) ?

## 2021-09-05 NOTE — Plan of Care (Signed)
  Problem: Activity: Goal: Ability to tolerate increased activity will improve Outcome: Progressing   Problem: Clinical Measurements: Goal: Ability to maintain a body temperature in the normal range will improve Outcome: Progressing   Problem: Respiratory: Goal: Ability to maintain adequate ventilation will improve Outcome: Progressing Goal: Ability to maintain a clear airway will improve Outcome: Progressing   Problem: Health Behavior/Discharge Planning: Goal: Ability to manage health-related needs will improve Outcome: Progressing   

## 2021-09-05 NOTE — ED Notes (Signed)
Pt care taken, pt has son at bedside, no complaints at this time. ?

## 2021-09-06 DIAGNOSIS — L8990 Pressure ulcer of unspecified site, unspecified stage: Secondary | ICD-10-CM | POA: Insufficient documentation

## 2021-09-06 LAB — URINE CULTURE

## 2021-09-06 LAB — BASIC METABOLIC PANEL
Anion gap: 5 (ref 5–15)
BUN: 9 mg/dL (ref 8–23)
CO2: 24 mmol/L (ref 22–32)
Calcium: 8.9 mg/dL (ref 8.9–10.3)
Chloride: 106 mmol/L (ref 98–111)
Creatinine, Ser: 0.54 mg/dL (ref 0.44–1.00)
GFR, Estimated: >60 mL/min (ref >60)
Glucose, Bld: 87 mg/dL (ref 70–99)
Potassium: 4.2 mmol/L (ref 3.5–5.1)
Sodium: 135 mmol/L (ref 135–145)

## 2021-09-06 LAB — CBC
HCT: 32.8 % — ABNORMAL LOW (ref 36.0–46.0)
Hemoglobin: 10.8 g/dL — ABNORMAL LOW (ref 12.0–15.0)
MCH: 29.6 pg (ref 26.0–34.0)
MCHC: 32.9 g/dL (ref 30.0–36.0)
MCV: 89.9 fL (ref 80.0–100.0)
Platelets: 224 10*3/uL (ref 150–400)
RBC: 3.65 MIL/uL — ABNORMAL LOW (ref 3.87–5.11)
RDW: 15.6 % — ABNORMAL HIGH (ref 11.5–15.5)
WBC: 11 10*3/uL — ABNORMAL HIGH (ref 4.0–10.5)
nRBC: 0 % (ref 0.0–0.2)

## 2021-09-06 LAB — CORTISOL-AM, BLOOD: Cortisol - AM: 21.3 ug/dL (ref 6.7–22.6)

## 2021-09-06 MED ORDER — ADULT MULTIVITAMIN W/MINERALS CH
1.0000 | ORAL_TABLET | Freq: Every day | ORAL | Status: DC
  Administered 2021-09-06 – 2021-09-07 (×2): 1 via ORAL
  Filled 2021-09-06 (×2): qty 1

## 2021-09-06 MED ORDER — ASCORBIC ACID 500 MG PO TABS: 500.0000 mg | ORAL_TABLET | Freq: Two times a day (BID) | ORAL | Status: DC

## 2021-09-06 MED ORDER — ZINC SULFATE 220 (50 ZN) MG PO CAPS: 220.0000 mg | ORAL_CAPSULE | Freq: Every day | ORAL | Status: DC

## 2021-09-06 MED ORDER — ENSURE ENLIVE PO LIQD
237.0000 mL | Freq: Two times a day (BID) | ORAL | Status: DC
  Administered 2021-09-07: 237 mL via ORAL

## 2021-09-06 NOTE — Progress Notes (Signed)
Initial Nutrition Assessment ? ?DOCUMENTATION CODES:  ? ?Not applicable ? ?INTERVENTION:  ? ?-Ensure Enlive po BID, each supplement provides 350 kcal and 20 grams of protein ?-MVI with minerals daily ?-Liberalize diet to regular for widest variety of meal selections ?-500 mg vitamin C BID ?-220 mg zinc sulfate daily x 14 days ? ?NUTRITION DIAGNOSIS:  ? ?Increased nutrient needs related to wound healing as evidenced by estimated needs. ? ?GOAL:  ? ?Patient will meet greater than or equal to 90% of their needs ? ?MONITOR:  ? ?PO intake, Supplement acceptance, Labs, Weight trends, Skin, I & O's ? ?REASON FOR ASSESSMENT:  ? ?Low [place] ?  ? ?ASSESSMENT:  ? ?Marie Perkins is a 88 y.o. female with medical history significant for Atrial fibrillation not on medication, generalized weakness, DNR, followed by [facility] palliative care who was brought to the ED for evaluation of left-sided chest pain that started a few hours prior to presentation, unrelieved with hydrocodone at home.  Patient describes pain as a pressure that radiates to the left arm..  Denies recent trauma.  Had no associated nausea, vomiting, shortness of breath, lightheadedness or palpitations.  Had a recent chest x-ray that showed old fractures. denies recent cough, fever or chills. ? ?Pt admitted with LLL pneumonia.  ? ?Reviewed I/O's: -100 ml x 24 hours ? ?UOP: 100 ml x 24 hours ? ?Pt unavailable at time of visit. RD unable to obtain further nutrition-related history or complete nutrition-focused physical exam at this time.   ? ?Pt currently on a heart healthy diet. Noted meal completions 10%. RD will liberalize diet to increase variety of meal selections.  ? ?Reviewed wt hx; pt has experienced a 3.6% wt loss over the past 2 months, which is not significant for time frame.  ? ?Per TOC notes, pt from [facility] and followed by palliative care. Pt does not desire additional diagnostic testing.  ?  ?Medications reviewed.  ? ?Labs reviewed.    ? ?Diet Order:   ?Diet Order   ? ?       ?  Diet Heart Room service appropriate? Yes; Fluid consistency: Thin  Diet effective now       ?  ? ?  ?  ? ?  ? ? ?EDUCATION NEEDS:  ? ?Not appropriate for education at this time ? ?Skin:  Skin Assessment: Skin Integrity Issues: ?Skin Integrity Issues:: Stage II, Other (Comment) ?Stage II: lt buttocks ?Other: skin tear to rt elbow ? ?Last BM:  Unknown ? ?Height:  ? ?Ht Readings from Last 1 Encounters:  ?09/04/21 5\' 9"  (1.753 m)  ? ? ?Weight:  ? ?Wt Readings from Last 1 Encounters:  ?09/04/21 61.2 kg  ? ? ?Ideal Body Weight:  65.9 kg ? ?BMI:  Body mass index is 19.94 kg/m?. ? ?Estimated Nutritional Needs:  ? ?Kcal:  1550-1750 ? ?Protein:  80-95 grams ? ?Fluid:  > 1.5 L ? ? ? ?11/04/21, RD, LDN, CDCES ?Registered Dietitian II ?Certified Diabetes Care and Education Specialist ?Please refer to [facility] for RD and/or RD on-call/weekend/after hours pager  ?

## 2021-09-06 NOTE — Progress Notes (Signed)
PROGRESS NOTE    Marie Perkins  [id number] DOB: 02-Nov-1926 DOA: 09/04/2021 PCP: Alvester Morin, MD     Brief Narrative:   From admission h and p Marie Perkins is a 88 y.o. female with medical history significant for Atrial fibrillation not on medication, generalized weakness, DNR, followed by [facility] palliative care who was brought to the ED for evaluation of left-sided chest pain that started a few hours prior to presentation, unrelieved with hydrocodone at home.  Patient describes pain as a pressure that radiates to the left arm..  Denies recent trauma.  Had no associated nausea, vomiting, shortness of breath, lightheadedness or palpitations.  Had a recent chest x-ray that showed old fractures. denies recent cough, fever or chills.   Assessment & Plan:   Principal Problem:   LLL pneumonia Active Problems:   Left-sided chest pain   Hyponatremia   Spinal stenosis   Generalized weakness   A-fib (HCC)   Rheumatic heart disease   Essential hypertension   Chest pain   Pressure injury of skin  # Chest pain Resolved. No signs ACS on EKG. Troponin mildly elevated to 36, was 33 on repeat, has hx of mild similar tropinemia, do not think ACS  # CAP, ruled out CXR with possible infiltrate. A bit altered from baseline, son reports low-grade temp earlier in the week, no convincing story of cough, sob, etc. Covid/flu neg, did have covid in December. CT without clear infiltrate, procal low. No fevers here, white count not impressive - stop abx - f/u blood cultures  # Bacteriuria Son says urinalysis grossly positive [facility], started on IM ceftriaxone, culture shows esbl e coli (culture results photo in media tab). No convincing story for symptomatic uti and daughter thinks patient could and would share if any dysuria. Here no fevers, flank pain, suprapubic tenderness. Curbsided ID and pharm d, consensus that unless clearly symptomatic best to hold off on tx as  treatment could breed resistance that would make future treatment even more difficult - will monitor overnight and assuming no onset of symptoms plan to d/c tomorrow - hold abx for now  # HTN Here bp wnl - cont home carvedilol  # Hyponatremia Na 125, was 134 a month ago. Improved to 135 with fluids - hold lasix, plan to hold at d/c - d/c fluids  # Hx a fib Not anticoagulated. Rate controlled - cont coreg  # Chronic back pain - home gabapentin, norco ordered  DVT prophylaxis: lovenox Code Status: DNR Family Communication: son updated telephonically and daughter at bedside  Level of care: Telemetry Medical Status is: inpt          Consultants:  none  Procedures: none  Antimicrobials:  Cef/azith    Subjective: Hard of hearing. Chest pain resolved. Denies cough or fever. No abd pain. Denies dysuria.  Objective: Vitals:   09/05/21 1603 09/05/21 2029 09/06/21 0405 09/06/21 0802  BP: (!) 149/97 117/70 125/78 (!) 124/96  Pulse: 87 84 74 93  Resp: 17 17 18 18   Temp: 97.9 F (36.6 C) 97.8 F (36.6 C) (!) 97.4 F (36.3 C) 98.3 F (36.8 C)  TempSrc:  Oral Axillary   SpO2: 98% 98% 94% 99%  Weight:      Height:        Intake/Output Summary (Last 24 hours) at 09/06/2021 1712 Last data filed at 09/06/2021 1700 Gross per 24 hour  Intake 2535 ml  Output 400 ml  Net 2135 ml   [employer]  09/04/21 1720  Weight: 61.2 kg    Examination:  General exam: Appears calm and comfortable  Respiratory system: Clear to auscultation save for basilar rales Cardiovascular system: S1 & S2 heard, RRR. Soft systolic murmur Gastrointestinal system: Abdomen is nondistended, soft and nontender. No organomegaly or masses felt. Normal bowel sounds heard. Central nervous system: hard of hearing. Moves all 4 Extremities: Symmetric 5 x 5 power. Skin: No rashes, lesions or ulcers Psychiatry: simple responses    Data Reviewed: I have personally reviewed following labs and  imaging studies  CBC: Recent Labs  Lab 09/04/21 1759 09/06/21 0443  WBC 11.8* 11.0*  HGB 11.1* 10.8*  HCT 33.8* 32.8*  MCV 88.7 89.9  PLT 206 [id number]   Basic Metabolic Panel: Recent Labs  Lab 09/04/21 1759 09/05/21 0440 09/06/21 0443  NA 125* 128* 135  K 4.4 4.7 4.2  CL 95* 99 106  CO2 24 23 24   GLUCOSE 101* 89 87  BUN 14 12 9   CREATININE 0.70 0.63 0.54  CALCIUM 9.3 8.9 8.9   GFR: Estimated Creatinine Clearance: 43.4 mL/min (by C-G formula based on SCr of 0.54 mg/dL). Liver Function Tests: No results for input(s): AST, ALT, ALKPHOS, BILITOT, PROT, ALBUMIN in the last 168 hours. No results for input(s): LIPASE, AMYLASE in the last 168 hours. No results for input(s): AMMONIA in the last 168 hours. Coagulation Profile: No results for input(s): INR, PROTIME in the last 168 hours. Cardiac Enzymes: No results for input(s): CKTOTAL, CKMB, CKMBINDEX, TROPONINI in the last 168 hours. BNP (last 3 results) No results for input(s): PROBNP in the last 8760 hours. HbA1C: No results for input(s): HGBA1C in the last 72 hours. CBG: No results for input(s): GLUCAP in the last 168 hours. Lipid Profile: No results for input(s): CHOL, HDL, LDLCALC, TRIG, CHOLHDL, LDLDIRECT in the last 72 hours. Thyroid Function Tests: Recent Labs    09/05/21 0938  TSH 0.926   Anemia Panel: No results for input(s): VITAMINB12, FOLATE, FERRITIN, TIBC, IRON, RETICCTPCT in the last 72 hours. Urine analysis:    Component Value Date/Time   COLORURINE YELLOW (A) 09/05/2021 1044   APPEARANCEUR CLOUDY (A) 09/05/2021 1044   LABSPEC 1.011 09/05/2021 1044   PHURINE 6.0 09/05/2021 1044   GLUCOSEU NEGATIVE 09/05/2021 1044   HGBUR SMALL (A) 09/05/2021 1044   BILIRUBINUR NEGATIVE 09/05/2021 1044   KETONESUR 20 (A) 09/05/2021 1044   PROTEINUR 30 (A) 09/05/2021 1044   NITRITE POSITIVE (A) 09/05/2021 1044   LEUKOCYTESUR LARGE (A) 09/05/2021 1044   Sepsis  Labs: @LABRCNTIP (procalcitonin:4,lacticidven:4)  ) Recent Results (from the past 240 hour(s))  Blood culture (routine x 2)     Status: None (Preliminary result)   Collection Time: 09/04/21 10:12 PM   Specimen: BLOOD LEFT HAND  Result Value Ref Range Status   Specimen Description BLOOD LEFT HAND  Final   Special Requests   Final    BOTTLES DRAWN AEROBIC AND ANAEROBIC Blood Culture results may not be optimal due to an inadequate volume of blood received in culture bottles   Culture   Final    NO GROWTH 2 DAYS Performed at [facility], [address] [place], [place] [address]    Report Status PENDING  Incomplete  Blood culture (routine x 2)     Status: None (Preliminary result)   Collection Time: 09/04/21 10:12 PM   Specimen: BLOOD RIGHT HAND  Result Value Ref Range Status   Specimen Description BLOOD RIGHT HAND  Final   Special Requests   Final  BOTTLES DRAWN AEROBIC AND ANAEROBIC Blood Culture results may not be optimal due to an inadequate volume of blood received in culture bottles   Culture   Final    NO GROWTH 2 DAYS Performed at [facility], [address], [place], [place] [address]    Report Status PENDING  Incomplete  Resp Panel by RT-PCR (Flu A&B, Covid) Nasopharyngeal Swab     Status: None   Collection Time: 09/05/21  7:43 AM   Specimen: Nasopharyngeal Swab; Nasopharyngeal(NP) swabs in vial transport medium  Result Value Ref Range Status   SARS Coronavirus 2 by RT PCR NEGATIVE NEGATIVE Final    Comment: (NOTE) SARS-CoV-2 target nucleic acids are NOT DETECTED.  The SARS-CoV-2 RNA is generally detectable in upper respiratory specimens during the acute phase of infection. The lowest concentration of SARS-CoV-2 viral copies this assay can detect is 138 copies/mL. A negative result does not preclude SARS-Cov-2 infection and should not be used as the sole basis for treatment or other patient management decisions. A negative result may occur with   improper specimen collection/handling, submission of specimen other than nasopharyngeal swab, presence of viral mutation(s) within the areas targeted by this assay, and inadequate number of viral copies(<138 copies/mL). A negative result must be combined with clinical observations, patient history, and epidemiological information. The expected result is Negative.  Fact Sheet for Patients:  EntrepreneurPulse.com.au  Fact Sheet for Healthcare Providers:  IncredibleEmployment.be  This test is no t yet approved or cleared by the [place] FDA and  has been authorized for detection and/or diagnosis of SARS-CoV-2 by FDA under an Emergency Use Authorization (EUA). This EUA will remain  in effect (meaning this test can be used) for the duration of the COVID-19 declaration under Section 564(b)(1) of the Act, 21 U.S.C.section 360bbb-3(b)(1), unless the authorization is terminated  or revoked sooner.       Influenza A by PCR NEGATIVE NEGATIVE Final   Influenza B by PCR NEGATIVE NEGATIVE Final    Comment: (NOTE) The Xpert Xpress SARS-CoV-2/FLU/RSV plus assay is intended as an aid in the diagnosis of influenza from Nasopharyngeal swab specimens and should not be used as a sole basis for treatment. Nasal washings and aspirates are unacceptable for Xpert Xpress SARS-CoV-2/FLU/RSV testing.  Fact Sheet for Patients: EntrepreneurPulse.com.au  Fact Sheet for Healthcare Providers: IncredibleEmployment.be  This test is not yet approved or cleared by the [place] FDA and has been authorized for detection and/or diagnosis of SARS-CoV-2 by FDA under an Emergency Use Authorization (EUA). This EUA will remain in effect (meaning this test can be used) for the duration of the COVID-19 declaration under Section 564(b)(1) of the Act, 21 U.S.C. section 360bbb-3(b)(1), unless the authorization is terminated  or revoked.  Performed at [facility], [address] [place], [place] [address]   Urine Culture     Status: Abnormal   Collection Time: 09/05/21 10:44 AM   Specimen: In/Out Cath Urine  Result Value Ref Range Status   Specimen Description   Final    IN/OUT CATH URINE Performed at [facility], [address] [place], [place] [address]    Special Requests   Final    NONE Performed at [facility], [address] [place], [place] [address]    Culture MULTIPLE SPECIES PRESENT, SUGGEST RECOLLECTION (A)  Final   Report Status 09/06/2021 FINAL  Final  MRSA Next Gen by PCR, Nasal     Status: Abnormal   Collection Time: 09/05/21  4:25 PM  Specimen: Nasal Mucosa; Nasal Swab  Result Value Ref Range Status   MRSA by PCR Next Gen DETECTED (A) NOT DETECTED Final    Comment: RESULT CALLED TO, READ BACK BY AND VERIFIED WITH: Wilder Glade RN AT 1900 09/05/2021 GA (NOTE) The GeneXpert MRSA Assay (FDA approved for NASAL specimens only), is one component of a comprehensive MRSA colonization surveillance program. It is not intended to diagnose MRSA infection nor to guide or monitor treatment for MRSA infections. Test performance is not FDA approved in patients less than 46 years old. Performed at [facility], [address] [place],  [address]          Radiology Studies: CT CHEST WO CONTRAST  Result Date: 09/05/2021 CLINICAL DATA:  Left lung mass. EXAM: CT CHEST WITHOUT CONTRAST TECHNIQUE: Multidetector CT imaging of the chest was performed following the standard protocol without IV contrast. RADIATION DOSE REDUCTION: This exam was performed according to the departmental dose-optimization program which includes automated exposure control, adjustment of the mA and/or kV according to patient size and/or use of iterative reconstruction technique. COMPARISON:  Chest x-ray 09/04/2021 FINDINGS: Cardiovascular: No significant vascular findings.  Mild cardiomegaly. No pericardial effusion. Thoracic aortic atherosclerosis. Coronary artery atherosclerosis. Mediastinum/Nodes: No enlarged mediastinal or axillary lymph nodes. Thyroid gland, trachea, and esophagus demonstrate no significant findings. Lungs/Pleura: Geographic areas ground-glass in the upper lobes bilaterally may be secondary to relative air trapping during expiration. Right middle lobe and bilateral lower lobe bronchiectasis with mild chronic interstitial thickening. Calcified 4 mm right middle lobe pulmonary nodule likely reflecting sequela prior granulomatous disease. Left basilar airspace disease likely reflecting atelectasis versus pneumonia. Upper Abdomen: 15 mm hypodense, fluid attenuating left hepatic mass most consistent with a cyst. Small hiatal hernia. Musculoskeletal: Severe osteoarthritis of bilateral glenohumeral joints. Levoscoliosis of the thoracic spine. Degenerative disease with disc height loss and facet arthropathy throughout the cervicothoracic spine. IMPRESSION: 1. Left basilar airspace disease likely reflecting atelectasis versus pneumonia. 2. Geographic areas of ground-glass in the upper lobes bilaterally, nonspecific. Findings could be secondary to chronic interstitial lung disease. 3. Right middle lobe and bilateral lower lobe bronchiectasis with mild chronic interstitial thickening. 4. Aortic Atherosclerosis (ICD10-I70.0). Electronically Signed   By: Kathreen Devoid M.D.   On: 09/05/2021 09:50   DG Chest Portable 1 View  Result Date: 09/04/2021 CLINICAL DATA:  Chest pain. EXAM: PORTABLE CHEST 1 VIEW COMPARISON:  Frontal and lateral views 07/31/2021 FINDINGS: Patient's chin obscures the left greater than right lung apex. Stable heart size with mild cardiomegaly. Unchanged mediastinal contours. Streaky right basilar atelectasis or scarring. There is new patchy opacity at the left lung base. No pulmonary edema. No large pleural effusion. No pneumothorax, allowing for  limitations related to positioning. Chronic changes of the shoulders. Thoracic compression fractures not well visualized in the absence of a lateral view IMPRESSION: 1. New patchy opacity at the left lung base, atelectasis versus pneumonia. 2. Streaky right basilar atelectasis or scarring. Electronically Signed   By: Keith Rake M.D.   On: 09/04/2021 18:18        Scheduled Meds:  vitamin C  500 mg Oral BID   carvedilol  3.125 mg Oral Q12H   Chlorhexidine Gluconate Cloth  6 each Topical Q0600   enoxaparin (LOVENOX) injection  40 mg Subcutaneous Q24H   [START ON 09/07/2021] feeding supplement  237 mL Oral BID BM   gabapentin  100 mg Oral QHS   HYDROcodone-acetaminophen  0.5 tablet Oral QHS   multivitamin with minerals  1 tablet  Oral Daily   mupirocin ointment  1 application. Nasal BID   zinc sulfate  220 mg Oral Daily   Continuous Infusions:  sodium chloride 100 mL/hr at 09/05/21 [id number]   azithromycin Stopped (09/05/21 2358)   cefTRIAXone (ROCEPHIN)  IV Stopped (09/05/21 2247)     LOS: 1 day    Time spent: [place], MD [facility]   If 7PM-7AM, please contact night-coverage [email or web address] Password [facility] 09/06/2021, 5:12 PM

## 2021-09-06 NOTE — Plan of Care (Signed)
  Problem: Activity: Goal: Ability to tolerate increased activity will improve Outcome: Progressing   Problem: Clinical Measurements: Goal: Ability to maintain a body temperature in the normal range will improve Outcome: Progressing   Problem: Respiratory: Goal: Ability to maintain adequate ventilation will improve Outcome: Progressing Goal: Ability to maintain a clear airway will improve Outcome: Progressing   Problem: Health Behavior/Discharge Planning: Goal: Ability to manage health-related needs will improve Outcome: Progressing   

## 2021-09-06 NOTE — TOC Progression Note (Addendum)
Transition of Care (TOC) - Progression Note  ? ? ?Patient Details  ?Name: Marie Perkins ?MRN: [id number] ?Date of Birth: [birth year] ? ?Transition of Care (TOC) CM/SW Contact  ?Marlowe Sax, RN ?Phone Number: ?09/06/2021, 8:58 AM ? ?Clinical Narrative:  From [facility]   followed by [facility] palliative care, they do not want any additional diagnostic testing,  ? ?Transition of Care (TOC) Screening Note ? ? ?Patient Details  ?Name: Marie Perkins ?Date of Birth: 09/21/1926 ? ? ?Transition of Care (TOC) CM/SW Contact:    ?Marlowe Sax, RN ?Phone Number: ?09/06/2021, 8:58 AM ? ? ? ?Transition of Care Department [facility]) has reviewed patient and no TOC needs have been identified at this time. We will continue to monitor patient advancement through interdisciplinary progression rounds. If new patient transition needs arise, please place a TOC consult. ?  ? ? ? ?  ?  ? ?Expected Discharge Plan and Services : Home with family and Palliative care ?  ?  ?  ?  ?  ?                ?  ?  ?  ?  ?  ?  ?  ?  ?  ?  ? ? ?Social Determinants of Health (SDOH) Interventions ?  ? ?Readmission Risk Interventions ?No flowsheet data found. ? ?

## 2021-09-06 NOTE — [place] FL2 (Signed)
?[place] MEDICAID FL2 LEVEL OF CARE SCREENING TOOL  ?  ? ?IDENTIFICATION  ?Patient Name: ?Marie Perkins Birthdate: 1926/11/19 Sex: female Admission Date (Current Location): ?09/04/2021  ?[place] Number: ?  ?  Facility and Address:  ?[facility], [address], [place], [place] [address] ?     Provider Number: ?[id number]  ?Attending Physician Name and Address:  ?Kathrynn Running, MD ? Relative Name and Phone Number:  ?Mills Koller [phone] ?   ?Current Level of Care: ?Hospital Recommended Level of Care: ?Nursing Facility Prior Approval Number: ?  ? ?Date Approved/Denied: ?  PASRR Number: ?  ? ?Discharge Plan: ?Other (Comment) ([facility]) ?  ? ?Current Diagnoses: ?Patient Active Problem List  ? Diagnosis Date Noted  ? Chest pain 09/05/2021  ? Rheumatic heart disease 09/04/2021  ? A-fib (HCC)   ? Hyponatremia   ? Left-sided chest pain   ? LLL pneumonia   ? Incontinence in female 06/15/2020  ? Hearing loss 06/15/2020  ? Spinal stenosis 06/15/2020  ? Generalized weakness 06/15/2020  ? Syndrome of inappropriate vasopressin secretion (HCC) 06/15/2020  ? Chronic arthritis 06/15/2020  ? Essential hypertension 11/03/2014  ? ? ?Orientation RESPIRATION BLADDER Height & Weight   ?  ?Self ? Normal Incontinent, External catheter Weight: 61.2 kg ?Height:  5\' 9"  (175.3 cm)  ?BEHAVIORAL SYMPTOMS/MOOD NEUROLOGICAL BOWEL NUTRITION STATUS  ?      Diet (see DC summary)  ?AMBULATORY STATUS COMMUNICATION OF NEEDS Skin   ?Total Care Verbally Normal ?  ?  ?  ?    ?     ?     ? ? ?Personal Care Assistance Level of Assistance  ?Bathing, Feeding, Dressing, Total care Bathing Assistance: Maximum assistance ?Feeding assistance: Maximum assistance ?Dressing Assistance: Maximum assistance ?Total Care Assistance: Maximum assistance  ? ?Functional Limitations Info  ?Sight, Hearing, Speech Sight Info: Impaired ?Hearing Info: Impaired ?Speech Info: Adequate  ? ? ?SPECIAL CARE FACTORS FREQUENCY  ?     ?  ?  ?  ?  ?  ?  ?   ? ? ?Contractures    ? ? ?Additional Factors Info  ?Code Status, Allergies Code Status Info: DNR ?Allergies Info: Crestor (Rosuvastatin Calcium), Pravastatin, Simvastatin ?  ?  ?  ?   ? ?Current Medications (09/06/2021):  This is the current hospital active medication list ?Current Facility-Administered Medications  ?Medication Dose Route Frequency Provider Last Rate Last Admin  ? 0.9 %  sodium chloride infusion   Intravenous Continuous 11/06/2021, MD 100 mL/hr at 09/05/21 0822 New Bag at 09/05/21 11/05/21  ? acetaminophen (TYLENOL) tablet 650 mg  650 mg Oral Q6H PRN 7017, MD      ? Or  ? acetaminophen (TYLENOL) suppository 650 mg  650 mg Rectal Q6H PRN Andris Baumann, MD      ? azithromycin (ZITHROMAX) 500 mg in sodium chloride 0.9 % 250 mL IVPB  500 mg Intravenous Q24H Wouk, Andris Baumann, MD   Stopped at 09/05/21 2358  ? carvedilol (COREG) tablet 3.125 mg  3.125 mg Oral Q12H Wouk, 11/05/21, MD   3.125 mg at 09/06/21 1058  ? cefTRIAXone (ROCEPHIN) 2 g in sodium chloride 0.9 % 100 mL IVPB  2 g Intravenous Q24H 11/06/21, MD   Stopped at 09/05/21 2247  ? Chlorhexidine Gluconate Cloth 2 % PADS 6 each  6 each Topical Q0600 Wouk, 2248, MD      ? enoxaparin (LOVENOX) injection 40 mg  40 mg Subcutaneous Q24H Lindajo Royal V, MD   40 mg at 09/05/21 2207  ? gabapentin (NEURONTIN) capsule 100 mg  100 mg Oral QHS Wouk, Wilfred Curtis, MD      ? haloperidol lactate (HALDOL) injection 0.5 mg  0.5 mg Intravenous Q6H PRN Andris Baumann, MD      ? HYDROcodone-acetaminophen (NORCO/VICODIN) 5-325 MG per tablet 0.5 tablet  0.5 tablet Oral QHS Wouk, Wilfred Curtis, MD      ? HYDROcodone-acetaminophen (NORCO/VICODIN) 5-325 MG per tablet 1 tablet  1 tablet Oral Q6H PRN Andris Baumann, MD      ? mupirocin ointment (BACTROBAN) 2 % 1 application  1 application. Nasal BID Wouk, Wilfred Curtis, MD   1 application. at 09/06/21 1059  ? ondansetron (ZOFRAN) tablet 4 mg  4 mg Oral Q6H PRN  Andris Baumann, MD      ? Or  ? ondansetron [facility]) injection 4 mg  4 mg Intravenous Q6H PRN Andris Baumann, MD      ? ? ? ?Discharge Medications: ?Please see discharge summary for a list of discharge medications. ? ?Relevant Imaging Results: ? ?Relevant Lab Results: ? ? ?Additional Information ?[id number] ? ?Marlowe Sax, RN ? ? ? ? ?

## 2021-09-07 DIAGNOSIS — R8271 Bacteriuria: Secondary | ICD-10-CM

## 2021-09-07 DIAGNOSIS — E871 Hypo-osmolality and hyponatremia: Principal | ICD-10-CM

## 2021-09-07 LAB — CBC
HCT: 32.1 % — ABNORMAL LOW (ref 36.0–46.0)
Hemoglobin: 10.6 g/dL — ABNORMAL LOW (ref 12.0–15.0)
MCH: 29.4 pg (ref 26.0–34.0)
MCHC: 33 g/dL (ref 30.0–36.0)
MCV: 88.9 fL (ref 80.0–100.0)
Platelets: 259 10*3/uL (ref 150–400)
RBC: 3.61 MIL/uL — ABNORMAL LOW (ref 3.87–5.11)
RDW: 15.4 % (ref 11.5–15.5)
WBC: 14.2 10*3/uL — ABNORMAL HIGH (ref 4.0–10.5)
nRBC: 0 % (ref 0.0–0.2)

## 2021-09-07 LAB — BASIC METABOLIC PANEL
Anion gap: 8 (ref 5–15)
BUN: 9 mg/dL (ref 8–23)
CO2: 22 mmol/L (ref 22–32)
Calcium: 9.2 mg/dL (ref 8.9–10.3)
Chloride: 105 mmol/L (ref 98–111)
Creatinine, Ser: 0.63 mg/dL (ref 0.44–1.00)
GFR, Estimated: >60 mL/min (ref >60)
Glucose, Bld: 130 mg/dL — ABNORMAL HIGH (ref 70–99)
Potassium: 4.2 mmol/L (ref 3.5–5.1)
Sodium: 135 mmol/L (ref 135–145)

## 2021-09-07 NOTE — TOC Progression Note (Signed)
Transition of Care (TOC) - Progression Note  ? ? ?Patient Details  ?Name: Marie Perkins ?MRN: [id number] ?Date of Birth: January 21, 1927 ? ?Transition of Care (TOC) CM/SW Contact  ?Marlowe Sax, RN ?Phone Number: ?09/07/2021, 9:55 AM ? ?Clinical Narrative:   Called EMS to transport to [facility] Nurse called report to [facility], The patient is next on EMS list to pick up, I called her son Marie Perkins, unable to reach, left a general VM requesting a call back, I spoke with her daughter in the room and notified her that the patient is next to be picked up to go to [facility] ? ? ? ?Expected Discharge Plan: Home w Hospice Care ?Barriers to Discharge: Continued Medical Work up ? ?Expected Discharge Plan and Services ?Expected Discharge Plan: Home w Hospice Care ?  ?  ?  ?  ?Expected Discharge Date: 09/07/21               ?  ?  ?  ?  ?  ?  ?  ?  ?  ?  ? ? ?Social Determinants of Health (SDOH) Interventions ?  ? ?Readmission Risk Interventions ?No flowsheet data found. ? ?

## 2021-09-07 NOTE — Progress Notes (Signed)
Report called to [employer].  ?

## 2021-09-07 NOTE — Discharge Summary (Signed)
Marie SonSarah W. Korpela ZOX:[id number] DOB: 1926-09-01 DOA: 09/04/2021  PCP: Keane PoliceSlade-Hartman, Venezela, MD  Admit date: 09/04/2021 Discharge date: 09/07/2021  Time spent: 35 minutes  Recommendations for Outpatient Follow-up:  Monitor sodium Monitor for symptoms of UTI, would treat only if clearly symptomatic  Hold lasix and monitor for signs fluid overload. Diuretic likely contributing to patient's dehydration and hyponatremia    Discharge Diagnoses:  Principal Problem:   LLL pneumonia Active Problems:   Left-sided chest pain   Hyponatremia   Spinal stenosis   Generalized weakness   A-fib (HCC)   Rheumatic heart disease   Essential hypertension   Chest pain   Pressure injury of skin   Discharge Condition: stable  Diet recommendation: regular  Filed Weights   09/04/21 1720  Weight: 61.2 kg    History of present illness:  From admission h and p Marie Perkins is a 88 y.o. female with medical history significant for Atrial fibrillation not on medication, generalized weakness, DNR, followed by [facility] palliative care who was brought to the ED for evaluation of left-sided chest pain that started a few hours prior to presentation, unrelieved with hydrocodone at home.  Patient describes pain as a pressure that radiates to the left arm..  Denies recent trauma.  Had no associated nausea, vomiting, shortness of breath, lightheadedness or palpitations.  Had a recent chest x-ray that showed old fractures. denies recent cough, fever or chills.  Hospital Course:  # Chest pain Resolved. No signs ACS on EKG. Troponin mildly elevated to 36, was 33 on repeat, has hx of mild similar tropinemia, do not think ACS   # CAP, ruled out CXR with possible infiltrate. A bit altered from baseline, but no clear respiratory symptoms.. Covid/flu neg, did have covid in December. CT without clear infiltrate, procal low. No fevers here, white count not impressive. Blood cultures negative   # Bacteriuria Son says  urinalysis grossly positive [facility], started on IM ceftriaxone, culture shows esbl e coli (culture results photo in media tab) which patient grew in our ED end of January. No convincing story for symptomatic uti and daughter thinks patient could and would share if any dysuria. Here no fevers, flank pain, suprapubic tenderness. Curbsided ID and pharm d, consensus that unless clearly symptomatic best to hold off on tx as treatment could breed resistance that would make future treatment even more difficult. In terms of outpatient treatment, fosfomycin would be a potential treatment option in the future but would treat only if patient clearly symptomatic. Family in agreement with this plan.   # HTN Here bp wnl - cont home carvedilol   # Hyponatremia Na 125, was 134 a month ago. Improved to 135 with fluids - hold lasix - attention to fluid intake as outpatient   # Hx a fib Not anticoagulated. Rate controlled - cont coreg   # Chronic back pain - home gabapentin, norco ordered  Procedures: none   Consultations: none  Discharge Exam: Vitals:   09/07/21 0509 09/07/21 0736  BP: 108/73 (!) 121/91  Pulse: 91 95  Resp: 17 16  Temp: 99.2 F (37.3 C) 99.3 F (37.4 C)  SpO2: 98% 97%    General exam: Appears calm and comfortable  Respiratory system: Clear to auscultation save for basilar rales Cardiovascular system: S1 & S2 heard, RRR. Soft systolic murmur Gastrointestinal system: Abdomen is nondistended, soft and nontender. No organomegaly or masses felt. Normal bowel sounds heard. Central nervous system: hard of hearing. Moves all 4 Extremities: Symmetric  5 x 5 power. Skin: No rashes, lesions or ulcers Psychiatry: simple responses  Discharge Instructions   Discharge Instructions     Diet - low sodium heart healthy   Complete by: As directed    Discharge wound care:   Complete by: As directed    Change dressing daily, frequent repositioning   Increase activity  slowly   Complete by: As directed       Allergies as of 09/07/2021       Reactions   Crestor [rosuvastatin Calcium]    Pravastatin    Simvastatin         Medication List     STOP taking these medications    Acidophilus Tabs   benazepril 10 MG tablet Commonly known as: LOTENSIN   cefTRIAXone 1 g injection Commonly known as: ROCEPHIN   furosemide 20 MG tablet Commonly known as: LASIX   nitrofurantoin (macrocrystal-monohydrate) 100 MG capsule Commonly known as: MACROBID       TAKE these medications    acetaminophen 650 MG CR tablet Commonly known as: TYLENOL Take 650 mg by mouth in the morning and at bedtime. Not to exceed 3 gm / day   bisacodyl 5 MG EC tablet Commonly known as: DULCOLAX Take 10 mg by mouth daily.   carvedilol 3.125 MG tablet Commonly known as: COREG Take 3.125 mg by mouth every 12 (twelve) hours. Hold for SBP < 110   cholecalciferol 25 MCG (1000 UNIT) tablet Commonly known as: VITAMIN D3 Take 2,000 Units by mouth daily.   EQL Omega 3 Fish Oil 1000 MG Caps Take 2 capsules by mouth in the morning.   gabapentin 100 MG capsule Commonly known as: NEURONTIN Take 100 mg by mouth at bedtime.   HYDROcodone-acetaminophen 5-325 MG tablet Commonly known as: NORCO/VICODIN Take 0.5 tablets by mouth at bedtime.   HYDROcodone-acetaminophen 5-325 MG tablet Commonly known as: NORCO/VICODIN Take 1 tablet by mouth every 6 (six) hours as needed for moderate pain or severe pain.   ipratropium-albuterol 0.5-2.5 (3) MG/3ML Soln Commonly known as: DUONEB Take 3 mLs by nebulization every 6 (six) hours as needed.   Lidocaine 4 % Ptch Apply 1 patch topically at bedtime. Remove patch in the morning.   multivitamin tablet Take 1 tablet by mouth daily.   polyethylene glycol 17 g packet Commonly known as: MIRALAX / GLYCOLAX Take 17 g by mouth daily.   polyvinyl alcohol 1.4 % ophthalmic solution Commonly known as: LIQUIFILM TEARS 1 drop at bedtime.    Zinc Oxide 25 % Pste Apply 1 application topically 3 (three) times daily. Apply a small amount topically at every shift.               Discharge Care Instructions  (From admission, onward)           Start     Ordered   09/07/21 0000  Discharge wound care:       Comments: Change dressing daily, frequent repositioning   09/07/21 7048           Allergies  Allergen Reactions   Crestor [Rosuvastatin Calcium]    Pravastatin    Simvastatin     Contact information for after-discharge care     Destination     [facility] .   Service: Skilled Nursing Contact information: [address] [place] [address] [phone]                      The results  of significant diagnostics from this hospitalization (including imaging, microbiology, ancillary and laboratory) are listed below for reference.    Significant Diagnostic Studies: CT CHEST WO CONTRAST  Result Date: 09/05/2021 CLINICAL DATA:  Left lung mass. EXAM: CT CHEST WITHOUT CONTRAST TECHNIQUE: Multidetector CT imaging of the chest was performed following the standard protocol without IV contrast. RADIATION DOSE REDUCTION: This exam was performed according to the departmental dose-optimization program which includes automated exposure control, adjustment of the mA and/or kV according to patient size and/or use of iterative reconstruction technique. COMPARISON:  Chest x-ray 09/04/2021 FINDINGS: Cardiovascular: No significant vascular findings. Mild cardiomegaly. No pericardial effusion. Thoracic aortic atherosclerosis. Coronary artery atherosclerosis. Mediastinum/Nodes: No enlarged mediastinal or axillary lymph nodes. Thyroid gland, trachea, and esophagus demonstrate no significant findings. Lungs/Pleura: Geographic areas ground-glass in the upper lobes bilaterally may be secondary to relative air trapping during expiration. Right middle lobe and bilateral lower lobe  bronchiectasis with mild chronic interstitial thickening. Calcified 4 mm right middle lobe pulmonary nodule likely reflecting sequela prior granulomatous disease. Left basilar airspace disease likely reflecting atelectasis versus pneumonia. Upper Abdomen: 15 mm hypodense, fluid attenuating left hepatic mass most consistent with a cyst. Small hiatal hernia. Musculoskeletal: Severe osteoarthritis of bilateral glenohumeral joints. Levoscoliosis of the thoracic spine. Degenerative disease with disc height loss and facet arthropathy throughout the cervicothoracic spine. IMPRESSION: 1. Left basilar airspace disease likely reflecting atelectasis versus pneumonia. 2. Geographic areas of ground-glass in the upper lobes bilaterally, nonspecific. Findings could be secondary to chronic interstitial lung disease. 3. Right middle lobe and bilateral lower lobe bronchiectasis with mild chronic interstitial thickening. 4. Aortic Atherosclerosis (ICD10-I70.0). Electronically Signed   By: Elige Ko M.D.   On: 09/05/2021 09:50   DG Chest Portable 1 View  Result Date: 09/04/2021 CLINICAL DATA:  Chest pain. EXAM: PORTABLE CHEST 1 VIEW COMPARISON:  Frontal and lateral views 07/31/2021 FINDINGS: Patient's chin obscures the left greater than right lung apex. Stable heart size with mild cardiomegaly. Unchanged mediastinal contours. Streaky right basilar atelectasis or scarring. There is new patchy opacity at the left lung base. No pulmonary edema. No large pleural effusion. No pneumothorax, allowing for limitations related to positioning. Chronic changes of the shoulders. Thoracic compression fractures not well visualized in the absence of a lateral view IMPRESSION: 1. New patchy opacity at the left lung base, atelectasis versus pneumonia. 2. Streaky right basilar atelectasis or scarring. Electronically Signed   By: Narda Rutherford M.D.   On: 09/04/2021 18:18    Microbiology: Recent Results (from the past 240 hour(s))  Blood  culture (routine x 2)     Status: None (Preliminary result)   Collection Time: 09/04/21 10:12 PM   Specimen: BLOOD LEFT HAND  Result Value Ref Range Status   Specimen Description BLOOD LEFT HAND  Final   Special Requests   Final    BOTTLES DRAWN AEROBIC AND ANAEROBIC Blood Culture results may not be optimal due to an inadequate volume of blood received in culture bottles   Culture   Final    NO GROWTH 3 DAYS Performed at [facility], [address] [place], [place] [address]    Report Status PENDING  Incomplete  Blood culture (routine x 2)     Status: None (Preliminary result)   Collection Time: 09/04/21 10:12 PM   Specimen: BLOOD RIGHT HAND  Result Value Ref Range Status   Specimen Description BLOOD RIGHT HAND  Final   Special Requests   Final    BOTTLES DRAWN AEROBIC AND ANAEROBIC  Blood Culture results may not be optimal due to an inadequate volume of blood received in culture bottles   Culture   Final    NO GROWTH 3 DAYS Performed at [facility], [address] [place], [place] [address]    Report Status PENDING  Incomplete  Resp Panel by RT-PCR (Flu A&B, Covid) Nasopharyngeal Swab     Status: None   Collection Time: 09/05/21  7:43 AM   Specimen: Nasopharyngeal Swab; Nasopharyngeal(NP) swabs in vial transport medium  Result Value Ref Range Status   SARS Coronavirus 2 by RT PCR NEGATIVE NEGATIVE Final    Comment: (NOTE) SARS-CoV-2 target nucleic acids are NOT DETECTED.  The SARS-CoV-2 RNA is generally detectable in upper respiratory specimens during the acute phase of infection. The lowest concentration of SARS-CoV-2 viral copies this assay can detect is 138 copies/mL. A negative result does not preclude SARS-Cov-2 infection and should not be used as the sole basis for treatment or other patient management decisions. A negative result may occur with  improper specimen collection/handling, submission of specimen other than nasopharyngeal swab, presence  of viral mutation(s) within the areas targeted by this assay, and inadequate number of viral copies(<138 copies/mL). A negative result must be combined with clinical observations, patient history, and epidemiological information. The expected result is Negative.  Fact Sheet for Patients:  BloggerCourse.comhttps://www.fda.gov/media/152166/download  Fact Sheet for Healthcare Providers:  SeriousBroker.ithttps://www.fda.gov/media/152162/download  This test is no t yet approved or cleared by the [place] FDA and  has been authorized for detection and/or diagnosis of SARS-CoV-2 by FDA under an Emergency Use Authorization (EUA). This EUA will remain  in effect (meaning this test can be used) for the duration of the COVID-19 declaration under Section 564(b)(1) of the Act, 21 U.S.C.section 360bbb-3(b)(1), unless the authorization is terminated  or revoked sooner.       Influenza A by PCR NEGATIVE NEGATIVE Final   Influenza B by PCR NEGATIVE NEGATIVE Final    Comment: (NOTE) The Xpert Xpress SARS-CoV-2/FLU/RSV plus assay is intended as an aid in the diagnosis of influenza from Nasopharyngeal swab specimens and should not be used as a sole basis for treatment. Nasal washings and aspirates are unacceptable for Xpert Xpress SARS-CoV-2/FLU/RSV testing.  Fact Sheet for Patients: BloggerCourse.comhttps://www.fda.gov/media/152166/download  Fact Sheet for Healthcare Providers: SeriousBroker.ithttps://www.fda.gov/media/152162/download  This test is not yet approved or cleared by the [place] FDA and has been authorized for detection and/or diagnosis of SARS-CoV-2 by FDA under an Emergency Use Authorization (EUA). This EUA will remain in effect (meaning this test can be used) for the duration of the COVID-19 declaration under Section 564(b)(1) of the Act, 21 U.S.C. section 360bbb-3(b)(1), unless the authorization is terminated or revoked.  Performed at [facility], [address] [place], [place] [address]   Urine Culture      Status: Abnormal   Collection Time: 09/05/21 10:44 AM   Specimen: In/Out Cath Urine  Result Value Ref Range Status   Specimen Description   Final    IN/OUT CATH URINE Performed at [facility], [address] [place], [place] [address]    Special Requests   Final    NONE Performed at [facility], [address] [place], [place] [address]    Culture MULTIPLE SPECIES PRESENT, SUGGEST RECOLLECTION (A)  Final   Report Status 09/06/2021 FINAL  Final  MRSA Next Gen by PCR, Nasal     Status: Abnormal   Collection Time: 09/05/21  4:25 PM   Specimen: Nasal Mucosa;  Nasal Swab  Result Value Ref Range Status   MRSA by PCR Next Gen DETECTED (A) NOT DETECTED Final    Comment: RESULT CALLED TO, READ BACK BY AND VERIFIED WITH: Lucy Antigua RN AT 1900 09/05/2021 GA (NOTE) The GeneXpert MRSA Assay (FDA approved for NASAL specimens only), is one component of a comprehensive MRSA colonization surveillance program. It is not intended to diagnose MRSA infection nor to guide or monitor treatment for MRSA infections. Test performance is not FDA approved in patients less than 31 years old. Performed at [facility], [address] [place], [place] [address]      Labs: Basic Metabolic Panel: Recent Labs  Lab 09/04/21 1759 09/05/21 0440 09/06/21 0443 09/07/21 0331  NA 125* 128* 135 135  K 4.4 4.7 4.2 4.2  CL 95* 99 106 105  CO2 24 23 24 22   GLUCOSE 101* 89 87 130*  BUN 14 12 9 9   CREATININE 0.70 0.63 0.54 0.63  CALCIUM 9.3 8.9 8.9 9.2   Liver Function Tests: No results for input(s): AST, ALT, ALKPHOS, BILITOT, PROT, ALBUMIN in the last 168 hours. No results for input(s): LIPASE, AMYLASE in the last 168 hours. No results for input(s): AMMONIA in the last 168 hours. CBC: Recent Labs  Lab 09/04/21 1759 09/06/21 0443 09/07/21 0331  WBC 11.8* 11.0* 14.2*  HGB 11.1* 10.8* 10.6*  HCT 33.8* 32.8* 32.1*  MCV 88.7 89.9 88.9  PLT 206 224 259   Cardiac  Enzymes: No results for input(s): CKTOTAL, CKMB, CKMBINDEX, TROPONINI in the last 168 hours. BNP: BNP (last 3 results) No results for input(s): BNP in the last 8760 hours.  ProBNP (last 3 results) No results for input(s): PROBNP in the last 8760 hours.  CBG: No results for input(s): GLUCAP in the last 168 hours.     Signed:  11/06/21 MD.  [facility] 09/07/2021, 9:29 AM

## 2021-09-09 LAB — CULTURE, BLOOD (ROUTINE X 2)
Culture: NO GROWTH
Culture: NO GROWTH

## 2021-10-04 ENCOUNTER — Non-Acute Institutional Stay: Payer: Medicare Other | Admitting: Primary Care

## 2021-10-04 DIAGNOSIS — Z7409 Other reduced mobility: Secondary | ICD-10-CM

## 2021-10-04 DIAGNOSIS — R5381 Other malaise: Secondary | ICD-10-CM

## 2021-10-04 DIAGNOSIS — Z515 Encounter for palliative care: Secondary | ICD-10-CM

## 2021-10-04 NOTE — Progress Notes (Signed)
? ? ?Manufacturing engineer ?Community Palliative Care Consult Note ?Telephone: [phone]  ?Fax: [phone]  ? ? ?Date of encounter: 10/04/21 ?2:32 PM ?PATIENT NAME: Marie Perkins ?[address] ?[place] [address]   ?[phone] (home)  ?DOB: 1927/05/31 ?MRN: [id number] ?PRIMARY CARE PROVIDER:    ?Alvester Morin, MD,  ?[place]. ?[place] [address] ?[phone] ? ?REFERRING PROVIDER:   ?Alvester Morin, MD ?[address]. ?[place],  [place] [address] ?[phone] ? ?RESPONSIBLE PARTY:    ?Contact Information   ? ? Name Relation Home Work Mobile  ? Duty,[name] Son [phone]    ? CLEMENTS,DIANE Daughter [phone]    ? ?  ? ?I met face to face with patient in [facility]. Palliative Care was asked to follow this patient by consultation request of  Slade-Hartman, Ivette Loyal* to address advance care planning and complex medical decision making. This is a follow up visit. ? ?                                 ASSESSMENT AND PLAN / RECOMMENDATIONS:  ? ?Advance Care Planning/Goals of Care: Goals include to maximize quality of life and symptom management.  ? ?         Son Marie Perkins called, no answer, message left. ?CODE STATUS: DNR ? ?Symptom Management/Plan: ? ?Visited pt in SNF. She was dozing and did not awake. Staff endorses she is declining but does eat 50% and gets oob daily to the recliner. In past 6 mos however this is for shorter times. Her interaction is also less.  ? ?On my visual exam she does appear to have wasting in her face. Wts show several pound loss. Staff states hospice was discussed with family but they would like to wait. Reached out to POA, no answer ? ?Follow up Palliative Care Visit: Palliative care will continue to follow for complex medical decision making, advance care planning, and clarification of goals. Return 4-6 weeks or prn. ? ?This visit was coded based on medical decision making (MDM). ? ?PPS: 30% ? ?HOSPICE ELIGIBILITY/DIAGNOSIS:  TBD ? ?Chief Complaint: debility ? ?HISTORY OF PRESENT ILLNESS:  Marie Perkins is a 88 y.o. year old female  with debility, immobility, hearing loss. Patient seen today to review palliative care needs to include medical decision making and advance care planning as appropriate.  ? ?History obtained from review of EMR, discussion with primary team, and interview with family, facility staff/caregiver and/or Marie Perkins.  ?I reviewed available labs, medications, imaging, studies and related documents from the EMR.  Records reviewed and summarized above.  ? ?ROS/staff ? ? ?General: NAD ?ENMT: denies dysphagia ?Pulmonary: denies cough, denies increased SOB ?Abdomen: endorses fair appetite, denies constipation, endorses incontinence of bowel ?GU: denies dysuria, endorses incontinence of urine ?MSK:  endorses  increased weakness,  no falls reported ?Skin: denies rashes or wounds ?Neurological: denies pain, denies insomnia ?Psych: Endorses positive mood ? ?Physical Exam: ?Current and past weights: 136 lbs, 3 lb wt loss, 3% ?Constitutional: NAD ?General: frail appearing, thin ?EYES: lids intact, no discharge  ?ENMT: hard of  hearing, oral mucous membranes moist, dentition intact ?Pulmonary:no increased work of breathing, no cough, room air ?Abdomen: intake 50%, no ascites ?MSK: + sarcopenia, moves all extremities, non  ambulatory ?Skin: warm and dry, no rashes or wounds on visible skin ?Neuro:  + generalized weakness,  + cognitive impairment, non-anxious affect ? ? ?Thank you for the opportunity to  participate in the care of Marie Perkins.  The palliative care team will continue to follow. Please call our office at [phone] if we can be of additional assistance.  ? ?Jason Coop, NP DNP, AGPCNP-BC ? ?COVID-19 PATIENT SCREENING TOOL ?Asked and negative response unless otherwise noted:  ? ?Have you had symptoms of covid, tested positive or been in contact with someone with symptoms/positive test in the past 5-10 days?   ? ?

## 2021-11-25 ENCOUNTER — Non-Acute Institutional Stay: Payer: Medicare Other | Admitting: Primary Care

## 2021-11-25 DIAGNOSIS — R531 Weakness: Secondary | ICD-10-CM

## 2021-11-25 DIAGNOSIS — R5381 Other malaise: Secondary | ICD-10-CM

## 2021-11-25 DIAGNOSIS — G8929 Other chronic pain: Secondary | ICD-10-CM

## 2021-11-25 NOTE — Progress Notes (Signed)
[facility] Consult Note Telephone: [phone]  Fax: [phone]    Date of encounter: 11/25/21 11:19 AM PATIENT NAME: Marie Perkins [address] [place] [address]   [phone] (home)  DOB: 1926/08/09 MRN: [id number] PRIMARY CARE PROVIDER:    Alvester Morin, MD,  [address]. [place] [address] [phone]  REFERRING PROVIDER:   Alvester Morin, MD [address]. [place],  [place] [address] [phone]  RESPONSIBLE PARTY:    Contact Information     Name Relation Home Work Mobile   Aman,[name] Son [place] Daughter [phone]         I [facility] face to face [facility]. Palliative Care was asked to follow this patient by consultation request of  Slade-Hartman, Ivette Loyal* to address advance care planning and complex medical decision making. This is a follow up visit.                                   ASSESSMENT AND PLAN / RECOMMENDATIONS:   Advance Care Planning/Goals of Care: Goals include to maximize quality of life and symptom management. Patient/health care surrogate gave his/her permission to discuss.Our advance care planning conversation included a discussion about:    The value and importance of advance care planning  Exploration of personal, cultural or spiritual beliefs that might influence medical decisions  Exploration of goals of care in the event of a sudden injury or illness  Identification of a healthcare agent - son Abbe [place] CODE STATUS: DNR Discussed goals for comfort and minimal interventions.  Symptom Management/Plan:  Pain: discussed her getting more long acting relief to address peak and trough dose failure, as well as not waking at 12 am and 6 am. Pain has escalated now with time in bed now that she is less active.  I recommend changing from current pain medication to morphine 1 ER 5 mg q 12 hours with prn available.  Pt is not getting prn and may not be aware to ask. She still has episodes of being in pain which limits mobility. Discussed with family who would like to try. This could be an immediate conversion to stop norco and begin morphine ER 15 mg bid. I will alert DON of my recommendation to discuss with Dr. Joaquin Courts.  Nutrition: Eating at meals but has lost 6 lbs in 4 months, this is 4%.  Mobility; Not getting oob much, maybe weekly. 6 months ago was in recliner most of the day. Has mattress cradle due to falling out of bed on 1 occasion. If she has better pain control she may be able to be oob more, although she is declining some to get up as well.   Follow up Palliative Care Visit: Palliative care will continue to follow for complex medical decision making, advance care planning, and clarification of goals. Return 6 weeks or prn.  This visit was coded based on medical decision making (MDM).  PPS: 30%  HOSPICE ELIGIBILITY/DIAGNOSIS: TBD  Chief Complaint: pain  HISTORY OF PRESENT ILLNESS:  Marie Perkins is a 88 y.o. year old female  with pain, immobility, hearing loss, skin breakdown . Patient seen today to review palliative care needs to include medical decision making and advance care planning as appropriate.   History obtained from review of EMR, discussion with primary team, and interview with family, facility staff/caregiver  and/or Ms. Twaddell.  I reviewed available labs, medications, imaging, studies and related documents from the EMR.  Records reviewed and summarized above.   ROS   General: NAD ENMT: denies dysphagia Pulmonary: denies cough, denies increased SOB Abdomen: endorses fair appetite, denies constipation, endorses incontinence of bowel GU: denies dysuria, endorses incontinence of urine MSK:  denies  increased weakness,  no falls reported Skin: denies rashes, reports wounds Neurological: endorses pain, denies insomnia Psych: Endorses positive mood  Physical  Exam: Current and past weights: 133 lbs Constitutional: NAD General: frail appearing, thin EYES: anicteric sclera, lids intact, no discharge  ENMT: very hard of hearing, oral mucous membranes moist, dentition intact CV: no LE edema Pulmonary: no increased work of breathing, no cough, room air Abdomen: intake 70%, n no ascites MSK: + sarcopenia, moves all extremities,  non ambulatory Skin: warm and dry, R hip stage 3 pressure ulcer noted in SNF record. Neuro:  +generalized weakness,  mild  cognitive impairment, non-anxious affect  Outpatient Encounter Medications as of 11/25/2021  Medication Sig   acetaminophen (TYLENOL) 650 MG CR tablet Take 650 mg by mouth in the morning and at bedtime. Not to exceed 3 gm / day   Amino Acids-Protein Hydrolys (PRO-STAT AWC) LIQD Take 30 mLs by mouth daily.   Ascorbic Acid (VITAMIN C) 100 MG tablet Take 500 mg by mouth in the morning and at bedtime.   bisacodyl (DULCOLAX) 5 MG EC tablet Take 10 mg by mouth daily.   gabapentin (NEURONTIN) 100 MG capsule Take 100 mg by mouth at bedtime.   HYDROcodone-acetaminophen (NORCO/VICODIN) 5-325 MG tablet Take 1 tablet by mouth every 6 (six) hours.   HYDROcodone-acetaminophen (NORCO/VICODIN) 5-325 MG tablet Take 1 tablet by mouth every 6 (six) hours as needed for moderate pain or severe pain.   Lidocaine 4 % PTCH Apply 1 patch topically at bedtime. Remove patch in the morning.   Multiple Vitamin (MULTIVITAMIN) tablet Take 1 tablet by mouth daily.   Omega-3 Fatty Acids (EQL OMEGA 3 FISH OIL) 1000 MG CAPS Take 2 capsules by mouth in the morning.   polyethylene glycol (MIRALAX / GLYCOLAX) 17 g packet Take 17 g by mouth daily.    polyvinyl alcohol (LIQUIFILM TEARS) 1.4 % ophthalmic solution 1 drop at bedtime.   SANTYL 250 UNIT/GM ointment Apply 1 application. topically daily.   Zinc Oxide 25 % PSTE Apply 1 application topically 3 (three) times daily. Apply a small amount topically at every shift.   ipratropium-albuterol  (DUONEB) 0.5-2.5 (3) MG/3ML SOLN Take 3 mLs by nebulization every 6 (six) hours as needed. (Patient not taking: Reported on 11/25/2021)   [DISCONTINUED] carvedilol (COREG) 3.125 MG tablet Take 3.125 mg by mouth every 12 (twelve) hours. Hold for SBP < 110   [DISCONTINUED] cholecalciferol (VITAMIN D3) 25 MCG (1000 UNIT) tablet Take 2,000 Units by mouth daily.   No facility-administered encounter medications on file as of 11/25/2021.    Thank you for the opportunity to participate in the care of Ms. Hudman.  The palliative care team will continue to follow. Please call our office at [phone] if we can be of additional assistance.   Jason Coop, NP DNP, AGPCNP-BC  COVID-19 PATIENT SCREENING TOOL Asked and negative response unless otherwise noted:   Have you had symptoms of covid, tested positive or been in contact with someone with symptoms/positive test in the past 5-10 days?

## 2022-03-11 ENCOUNTER — Non-Acute Institutional Stay: Payer: Medicare Other | Admitting: Primary Care

## 2022-03-11 DIAGNOSIS — R5382 Chronic fatigue, unspecified: Secondary | ICD-10-CM

## 2022-03-11 DIAGNOSIS — R531 Weakness: Secondary | ICD-10-CM

## 2022-03-11 DIAGNOSIS — Z7409 Other reduced mobility: Secondary | ICD-10-CM

## 2022-03-11 DIAGNOSIS — Z515 Encounter for palliative care: Secondary | ICD-10-CM

## 2022-03-11 NOTE — Progress Notes (Signed)
    [facility] Consult Note Telephone: [phone]  Fax: [phone]    Date of encounter: 03/11/22 PATIENT NAME: Marie Perkins [id number]   [phone] (home)  DOB: 1926/12/28 MRN: [id number] PRIMARY CARE PROVIDER:    Alvester Morin, MD,  [place]. Marie Perkins [place] [address] [phone]  REFERRING PROVIDER:   Alvester Morin, MD [place]. [place],  Marie Perkins [phone]  RESPONSIBLE PARTY:    Contact Information     Name Relation Home Work Mobile   Marie Perkins Son [place] Daughter [phone]          I met face to face with [facility] in [facility]. Palliative Care was asked to follow this patient by consultation request of  Marie Perkins* to address advance care planning.                                   ASSESSMENT AND PLAN / RECOMMENDATIONS:   Advance Care Planning/Goals of Care: Goals include to maximize quality of life and symptom management.   Assessed for hospice appropriateness, discussed recent decline with staff Pt has moderate pain, decreasing mobility. Eating 50% and has lost 13 lbs in 4 mos, 9%. No wounds currently, one has recently healed Exploration of personal, cultural or spiritual beliefs that might influence medical decisions - discussed family meeting with staff, from several weeks ago Exploration of goals of care in the event of a sudden injury or illness  Identification of a healthcare agent - Son Marie Perkins, I called him to discuss but no answer, message left for him to f/u with calling back next week. Review of an  advance directive document - DNR. Staff earlier Discussed with family  RE going to hospital. Pt reiterated she didn't want to go, I concur her best care would be provided in place due to her advanced frailty. Met with patient who said she was doing ok. She was trying to get a drink, and I  asked staff to assist her. CODE STATUS DNR  S- I"m doing well O- VSS, weights declining, intake 50%. Assist with all adls. A-Pt declining although slowly with debility, able to help with some feeding. Now bedbound, and interacts but with effort and fatigue P: would qualify for hospice, I would recommend as this would augment current care plan without any change in current goals of care, which are comfort oriented and pt has requested to not go to hospital for events.  Follow up Palliative Care Visit: Palliative care will continue to follow for complex medical decision making, advance care planning, and clarification of goals. Return 4-6 weeks or prn.  I spent 15 minutes providing this consultation. More than 50% of the time in this consultation was spent in counseling and care coordination.  Thank you for the opportunity to participate in the care of Marie Perkins.  The palliative care team will continue to follow. Please call our office at [phone] if we can be of additional assistance.   Marie Coop, NP ,   COVID-19 PATIENT SCREENING TOOL Asked and negative response unless otherwise noted:   Have you had symptoms of covid, tested positive or been in contact with someone with symptoms/positive test in the past 5-10 days?

## 2022-05-20 ENCOUNTER — Non-Acute Institutional Stay: Payer: Medicare Other | Admitting: Primary Care

## 2022-05-20 DIAGNOSIS — R531 Weakness: Secondary | ICD-10-CM

## 2022-05-20 DIAGNOSIS — Z515 Encounter for palliative care: Secondary | ICD-10-CM

## 2022-05-20 NOTE — Progress Notes (Signed)
[facility] Consult Note Telephone: [phone]  Fax: [phone]    Date of encounter: 05/20/22 10:52 AM PATIENT NAME: [place] [address]   [phone] (home)  DOB: October 27, 1926 MRN: [id number] PRIMARY CARE PROVIDER:    Alvester Morin, MD,  [place]. [place] [address] [phone]  REFERRING PROVIDER:   Alvester Morin, MD [address]. [place],  [place] [address] [phone]  RESPONSIBLE PARTY:    Contact Information     Name Relation Home Work Mobile   Mont[place]ro,[name] Son [place] Daughter [phone]          [facility]. Palliative Care was asked to follow this patient by consultation request of  Slade-Hartman, Ivette Loyal* to address advance care planning and complex medical decision making. This is a follow up visit.                                   ASSESSMENT AND PLAN / RECOMMENDATIONS:   Advance Care Planning/Goals of Care: Goals include to maximize quality of life and symptom management. CODE STATUS: DNR Symptom Management/Plan:    Debility: Patient very debilitated since my last visit. Today she regards me but is nonverbal. She appears confused twisting oxygen tubing  Weight : She has lost significant amount of weight. She has lost 9% of her weight in five months. I would recommend weekly weights. Once she has lost 10% of her weight in the past three to four months . she would qualify for Hospice services if family desires. I would recommend this for patient and family support.  ADLs She now has full care needs.   Dyspnea: None on rest. Oxygen is in place at 2  liters. O2 Sats 93-96%.  Follow up Palliative Care Visit: Palliative care will continue to follow for complex medical decision making, advance care planning, and clarification of goals. Return 2-4 weeks or prn.  This visit was  coded based on medical decision making (MDM).  PPS: 30%  HOSPICE ELIGIBILITY/DIAGNOSIS: yes/weight loss  Chief Complaint: debility, weight loss  HISTORY OF PRESENT ILLNESS:  PAYSLIE MCCAIG is a 88 y.o. year old female  with debility, weight loss, a fib, hearing loss .   History obtained from review of EMR, discussion with primary team, and interview with family, facility staff/caregiver and/or Ms. Zeiser.  I reviewed available labs, medications, imaging, studies and related documents from the EMR.  Records reviewed and summarized above.   ROS [place] [facility]  General: NAD ENMT: denies dysphagia Cardiovascular:  denies DOE Pulmonary: denies cough, denies increased SOB Abdomen: endorses fair  appetite, denies constipation, endorses incontinence of bowel GU: denies dysuria, endorses incontinence of urine MSK:  endorses  increased weakness,  no falls reported Skin: denies rashes or wounds Neurological: denies pain, denies insomnia Psych: Endorses flat mood Heme/lymph/immuno: denies bruises, abnormal bleeding  Physical Exam: Current and past weights: 114 lbs, 9% loss in 5 mos. Constitutional: NAD General: frail appearing, thin EYES: anicteric sclera, lids intact, no discharge  ENMT: hard of  hearing, oral mucous membranes moist CV: no LE edema Pulmonary:  no increased work of breathing, no cough,oxygen 2 L Abdomen: intake 50%,  soft and non tender, no ascites GU: deferred MSK: + sarcopenia, moves all extremities,  non ambulatory Skin: warm and dry, no rashes or wounds on  visible skin Neuro:  + generalized weakness,  + cognitive impairment Psych:anxious affect, A and O x 1 Hem/lymph/immuno: no widespread bruising  Thank you for the opportunity to participate in the care of Ms. Sailors. Please call our office at [phone] if we can be of additional assistance.  Jason Coop DNP, MPH, AGPCNP-BC, ACHPN   COVID-19 PATIENT SCREENING TOOL Asked and negative response unless  otherwise noted:   Have you had symptoms of covid, tested positive or been in contact with someone with symptoms/positive test in the past 5-10 days?

## 2022-06-08 ENCOUNTER — Non-Acute Institutional Stay: Payer: Medicare Other | Admitting: Nurse Practitioner

## 2022-06-08 ENCOUNTER — Encounter: Payer: Self-pay | Admitting: Nurse Practitioner

## 2022-06-08 DIAGNOSIS — Z515 Encounter for palliative care: Secondary | ICD-10-CM

## 2022-06-08 DIAGNOSIS — R627 Adult failure to thrive: Secondary | ICD-10-CM

## 2022-06-08 DIAGNOSIS — R531 Weakness: Secondary | ICD-10-CM

## 2022-06-08 DIAGNOSIS — R5381 Other malaise: Secondary | ICD-10-CM

## 2022-06-08 NOTE — Progress Notes (Unsigned)
[facility] Consult Note Telephone: [phone]  Fax: [phone]    Date of encounter: 06/08/22 11:40 PM PATIENT NAME: Marie Perkins [address] [place] [address]   [phone] (home)  DOB: 1926/09/06 MRN: [id number] PRIMARY CARE PROVIDER:   [facility] Marie Morin, MD,  [place]. Marie Perkins [phone]  RESPONSIBLE PARTY:    Contact Information     Name Relation Home Work Mobile   Marie Perkins Son [place] Daughter [phone]        I met face to face with patient in facility. Palliative Care was asked to follow this patient by consultation request of  Marie Perkins, Marie Perkins*/[facility] to address advance care planning and complex medical decision making. This is a follow up visit.                                  ASSESSMENT AND PLAN / RECOMMENDATIONS:  Symptom Management/Plan:  1. Advance Care Planning;  DNR 2. Goals of Care: Goals include to maximize quality of life and symptom management. Our advance care planning conversation included a discussion about:    The value and importance of advance care planning  Exploration of personal, cultural or spiritual beliefs that might influence medical decisions  Exploration of goals of care in the event of a sudden injury or illness  Identification and preparation of a healthcare agent  Review and updating or creation of an advance directive document. 3. Palliative care encounter; Palliative care encounter; Palliative medicine team will continue to support patient, patient's family, and medical team. Visit consisted of counseling and education dealing with the complex and emotionally intense issues of symptom management and palliative care in the setting of serious and potentially life-threatening illness  4. Adult failure to thrive, generalized weakness, anorexia, debility ongoing, continue to monitor weights, with no  current weights. Supportive care, will need further discussions with son about goc, option of hospice services with overall decline, debility, progression of chronic disease in the setting of aging.   Follow up Palliative Care Visit: Palliative care will continue to follow for complex medical decision making, advance care planning, and clarification of goals. Return 4 to 6 weeks or prn.  I spent 36 minutes providing this consultation starting at 1:30 pm. More than 50% of the time in this consultation was spent in counseling and care coordination. PPS: 30% Chief Complaint: Follow up palliative consult for complex medical decision making, address goals, manage ongoing symptoms  HISTORY OF PRESENT ILLNESS:  Marie Perkins is a 88 y.o. year old female  with multiple medical problems including afib, HTN, rheumatic heart disease, chronic arthritis, hearing loss, h/o hyponatremia, syndrome of inappropriate vasopressin secretion. [name] resides at [facility], requires assistance for transfers, mobility, adl's bathing, dressing, feeding with declined appetite per staff.  At present [name] is lying in bed, sleeping, arouses to verbal cues, appears comfortable. No visitors present. [name] was cooperative with assessment, though briefly opened eyes then returned to sleep. No meaningful discussion with cognitive impairment. Medical goals, medications, poc reviewed. Attempted to contact RP, updated staff, will continue to monitor. Supportive role    History obtained from review of EMR, discussion with primary team, and interview with family, facility staff/caregiver and/or [name].  I reviewed available labs, medications, imaging, studies and related documents from the EMR.  Records reviewed and summarized above.  ROS 10 point system reviewed all negative except HPI  Physical Exam: Constitutional: NAD General: frail appearing, thin, chronically ill, cognitively impaired female ENMT: oral mucous  membranes moist CV: S1S2, RRR Pulmonary: clear breath sounds, decreased throughout Abdomen: normo-active BS + 4 quadrants, soft and non tender MSK: +muscle wasting Skin: warm and dry Neuro:  + generalized weakness,  + cognitive impairment Psych: non-anxious affect, lethargic Thank you for the opportunity to participate in the care of [name]. Please call our office at [phone] if we can be of additional assistance.   Marie Perkins Marie Gully, NP

## 2022-07-15 ENCOUNTER — Encounter: Payer: Self-pay | Admitting: Nurse Practitioner

## 2022-07-15 ENCOUNTER — Non-Acute Institutional Stay: Payer: Medicare Other | Admitting: Nurse Practitioner

## 2022-07-15 DIAGNOSIS — R531 Weakness: Secondary | ICD-10-CM

## 2022-07-15 DIAGNOSIS — R5381 Other malaise: Secondary | ICD-10-CM

## 2022-07-15 DIAGNOSIS — R627 Adult failure to thrive: Secondary | ICD-10-CM

## 2022-07-15 DIAGNOSIS — Z515 Encounter for palliative care: Secondary | ICD-10-CM

## 2022-07-15 NOTE — Progress Notes (Signed)
[facility] Consult Note Telephone: [phone]  Fax: [phone]    Date of encounter: 07/15/22 1:24 PM PATIENT NAME: Marie Perkins [address] [place] [address]   [phone] (home)  DOB: Jun 25, 1927 MRN: [id number] PRIMARY CARE PROVIDER:   [facility] Alvester Morin, MD,  [place]. [place] [address] [phone]  RESPONSIBLE PARTY:    Contact Information     Name Relation Home Work Mobile   Perkins,Marie Son [place] Daughter [phone]       I met face to face with patient in facility. Palliative Care was asked to follow this patient by consultation request of  Slade-Hartman, Venezela[facility] to address advance care planning and complex medical decision making. This is a follow up visit.                                  ASSESSMENT AND PLAN / RECOMMENDATIONS:  Symptom Management/Plan:  1. Advance Care Planning;  DNR Discussed with Marie Perkins decline, debility, option of hospice services under medicare benefit, Marie Perkins in agreement to proceed with hospice.  2. Goals of Care: Goals include to maximize quality of life and symptom management. Our advance care planning conversation included a discussion about:    The value and importance of advance care planning  Exploration of personal, cultural or spiritual beliefs that might influence medical decisions  Exploration of goals of care in the event of a sudden injury or illness  Identification and preparation of a healthcare agent  Review and updating or creation of an advance directive document. 3. Palliative care encounter; Palliative care encounter; Palliative medicine team will continue to support patient, patient's family, and medical team. Visit consisted of counseling and education dealing with the complex and emotionally intense issues of symptom management and palliative care in the setting of serious and  potentially life-threatening illness   4. Adult failure to thrive, generalized weakness, anorexia, debility ongoing, continue to monitor weights, with no current weights. Supportive care, will need further discussions with son about goc, option of hospice services with overall decline, debility, progression of chronic disease in the setting of aging.  10/2021 weight 136 lb 05/21/19 weight 114 lb 07/05/2022 weight 98.7 lb Follow up Palliative Care Visit: Palliative care will continue to follow for complex medical decision making, advance care planning, and clarification of goals. Return 1 to 2 weeks or prn.   I spent 46 minutes providing this consultation starting at 1:30 pm. More than 50% of the time in this consultation was spent in counseling and care coordination. PPS: 30% Chief Complaint: Follow up palliative consult for complex medical decision making, address goals, manage ongoing symptoms   HISTORY OF PRESENT ILLNESS:  Marie Perkins is a 89 y.o. year old female  with multiple medical problems including afib, HTN, rheumatic heart disease, chronic arthritis, hearing loss, h/o hyponatremia, syndrome of inappropriate vasopressin secretion. Marie Perkins resides at [facility], requires assistance for transfers, mobility, adl's bathing, dressing, feeding with declined appetite per staff.  At present Marie Perkins is lying in bed, sleeping, arouses to verbal cues, appears comfortable. No visitors present. Marie Perkins was cooperative with assessment. No meaningful discussion with Marie Perkins with cognitive decline, appears to be progressing to end of life with clinical presentation. I called Marie Perkins son, Marie Perkins. We talked about pc visit, clinical update, weights, appetite, overall  decline functionally, cognitively in the setting of chronic disease and aging. We talked about hospice services under Medicare benefit. Marie Perkins in agreement to start process of hospice, updated staff for order for hospice. Support provided.    History obtained from review of EMR, discussion with primary team, and interview with family, facility staff/caregiver and/or Marie Perkins.  I reviewed available labs, medications, imaging, studies and related documents from the EMR.  Records reviewed and summarized above.    Physical Exam: Constitutional: NAD General: frail appearing, thin, chronically ill, cognitively impaired female ENMT: oral mucous membranes moist CV: S1S2, RRR Pulmonary: clear breath sounds, decreased throughout Abdomen: normo-active BS + 4 quadrants, soft and non tender MSK: +muscle wasting Skin: warm and dry Neuro:  + generalized weakness,  + cognitive impairment Psych: flat affect, alert Thank you for the opportunity to participate in the care of Marie Perkins. Please call our office at [phone] if we can be of additional assistance.   Jarious Lyon Ihor Gully, NP

## 2022-08-24 ENCOUNTER — Non-Acute Institutional Stay: Payer: Medicare Other | Admitting: Nurse Practitioner

## 2022-08-24 DIAGNOSIS — Z515 Encounter for palliative care: Secondary | ICD-10-CM

## 2022-08-24 DIAGNOSIS — R627 Adult failure to thrive: Secondary | ICD-10-CM

## 2022-08-24 DIAGNOSIS — R5381 Other malaise: Secondary | ICD-10-CM

## 2022-08-24 NOTE — Progress Notes (Addendum)
[place] Consult Note Telephone: [phone]  Fax: [phone]    Date of encounter: 08/24/22 7:48 PM PATIENT NAME: CIRE GALAN [address] [place] [address]   [phone] (home)  DOB: 05/06/1927 MRN: [id number] PRIMARY CARE PROVIDER:    Alvester Morin, MD,  [facility]  RESPONSIBLE PARTY:    Contact Information     Name Relation Home Work Mobile   Navarra,[name] Son [place] Daughter [phone]       I met face to face with patient in facility. Palliative Care was asked to follow this patient by consultation request of  [facility]man, [facility]*/[facility] to address advance care planning and complex medical decision making. This is a follow up visit.                                  ASSESSMENT AND PLAN / RECOMMENDATIONS:  Symptom Management/Plan:  1. Advance Care Planning;  DNR Discussed with Mr larayne roffers decline, debility, option of hospice services under medicare benefit, Mr Hipps in agreement to proceed with hospice.  2. Goals of Care: Goals include to maximize quality of life and symptom management. Our advance care planning conversation included a discussion about:    The value and importance of advance care planning  Exploration of personal, cultural or spiritual beliefs that might influence medical decisions  Exploration of goals of care in the event of a sudden injury or illness  Identification and preparation of a healthcare agent  Review and updating or creation of an advance directive document. 3. Palliative care encounter; Palliative care encounter; Palliative medicine team will continue to support patient, patient's family, and medical team. Visit consisted of counseling and education dealing with the complex and emotionally intense issues of symptom management and palliative care in the setting of serious and potentially life-threatening illness   4. Adult failure to  thrive, generalized weakness, anorexia, debility ongoing, continue to monitor weights, with no current weights. Supportive care, will need further discussions with son about goc, option of hospice services with overall decline, debility, progression of chronic disease in the setting of aging.  10/2021 weight 136 lb 05/21/19 weight 114 lb 07/05/2022 weight 98.7 lb Follow up Palliative Care Visit: Palliative care will continue to follow for complex medical decision making, advance care planning, and clarification of goals. Return 2 to 4 weeks or prn.   I spent 47 minutes providing this consultation starting at 12:30 pm. More than 50% of the time in this consultation was spent in counseling and care coordination. PPS: 30% Chief Complaint: Follow up palliative consult for complex medical decision making, address goals, manage ongoing symptoms   HISTORY OF PRESENT ILLNESS:  AREION PROTZMAN is a 89 y.o. year old female  with multiple medical problems including afib, HTN, rheumatic heart disease, chronic arthritis, hearing loss, h/o hyponatremia, syndrome of inappropriate vasopressin secretion. Ms. Krider resides at [facility], requires assistance for transfers, mobility, adl's bathing, dressing, feeding with declined appetite per staff.  At present Ms. Rosenburg is lying in bed, sleeping, arouses to verbal cues, appears comfortable. No visitors present. Last PC visit, lengthily discussion with son about hospice and was agreeable, proceeded with hospice order then family declined hospice. Ms Fehring continues to have to be fed with very poor intake, continuous O2. I visited and observed Ms Antes, awoke to verbal cues then returned to sleep. Ms Orzech  was cooperative with assessment, no meaningful discussion with cognitive impairment. Support provided. Will follow close with ongoing discussions with son/family about progression to end part of Ms Link life, with medical goals, medications, poc reviewed. Attempted to contact  son. No new changes today, appears comfortable.    History obtained from review of EMR, discussion with primary team, and interview with family, facility staff/caregiver and/or Ms. Cutillo.  I reviewed available labs, medications, imaging, studies and related documents from the EMR.  Records reviewed and summarized above.    Physical Exam: Constitutional: NAD General: frail appearing, thin, chronically ill, cognitively impaired female ENMT: oral mucous membranes moist CV: S1S2, RRR Pulmonary: clear breath sounds, decreased throughout Abdomen: normo-active BS + 4 quadrants, soft and non tender MSK: +muscle wasting Skin: warm and dry Neuro:  + generalized weakness,  + cognitive impairment Psych: flat affect, alert  Thank you for the opportunity to participate in the care of Ms. Kilian. Please call our office at [phone] if we can be of additional assistance.   Evadna Donaghy Ihor Gully, NP

## 2022-09-19 ENCOUNTER — Encounter: Payer: Self-pay | Admitting: Nurse Practitioner

## 2022-09-19 ENCOUNTER — Non-Acute Institutional Stay: Payer: Medicare Other | Admitting: Nurse Practitioner

## 2022-09-19 DIAGNOSIS — R531 Weakness: Secondary | ICD-10-CM

## 2022-09-19 DIAGNOSIS — R5381 Other malaise: Secondary | ICD-10-CM

## 2022-09-19 DIAGNOSIS — R627 Adult failure to thrive: Secondary | ICD-10-CM

## 2022-09-19 DIAGNOSIS — Z515 Encounter for palliative care: Secondary | ICD-10-CM

## 2022-09-19 NOTE — Progress Notes (Signed)
[facility] Consult Note Telephone: [phone]  Fax: [phone]    Date of encounter: 09/19/22 3:12 PM PATIENT NAME: Marie Perkins [address] [place] [address]   [phone] (home)  DOB: 04-03-1927 MRN: [id number] PRIMARY CARE PROVIDER:    Alvester Morin, MD,  [facility]  RESPONSIBLE PARTY:    Contact Information     Name Relation Home Work Mobile   Perkins,Marie Son [place] Daughter [phone]       I met face to face with patient in facility. Palliative Care was asked to follow this patient by consultation request of  Marie Perkins, Marie Perkins*/[facility] to address advance care planning and complex medical decision making. This is a follow up visit.                                  ASSESSMENT AND PLAN / RECOMMENDATIONS:  Symptom Management/Plan:  1. Advance Care Planning;  DNR  2. Goals of Care: Goals include to maximize quality of life and symptom management. Our advance care planning conversation included a discussion about:    The value and importance of advance care planning  Exploration of personal, cultural or spiritual beliefs that might influence medical decisions  Exploration of goals of care in the event of a sudden injury or illness  Identification and preparation of a healthcare agent  Review and updating or creation of an advance directive document. 3. Palliative care encounter; Palliative care encounter; Palliative medicine team will continue to support patient, patient's family, and medical team. Visit consisted of counseling and education dealing with the complex and emotionally intense issues of symptom management and palliative care in the setting of serious and potentially life-threatening illness   4. Adult failure to thrive, generalized weakness, anorexia, debility ongoing, continue to monitor weights, with no current weights. Supportive care, will need further  discussions with son about goc, option of hospice services with overall decline, debility, progression of chronic disease in the setting of aging.  10/2021 weight 136 lb 05/21/19 weight 114 lb 07/05/2022 weight 98.7 lb Follow up Palliative Care Visit: Palliative care will continue to follow for complex medical decision making, advance care planning, and clarification of goals. Return 2 to 4 weeks or prn.   I spent 45 minutes providing this consultation. More than 50% of the time in this consultation was spent in counseling and care coordination. PPS: 30% Chief Complaint: Follow up palliative consult for complex medical decision making, address goals, manage ongoing symptoms   HISTORY OF PRESENT ILLNESS:  Marie Perkins is a 89 y.o. year old female  with multiple medical problems including afib, HTN, rheumatic heart disease, chronic arthritis, hearing loss, h/o hyponatremia, syndrome of inappropriate vasopressin secretion. Marie Perkins resides at [facility], requires assistance for transfers, mobility, adl's bathing, dressing, feeding with declined appetite per staff.  Purpose of today PC f/u visit further discussion monitor trends of appetite, weights, monitor for functional, cognitive decline with chronic disease progression, assess any active symptoms, supportive role. At present Marie Perkins is lying in bed, sleeping, arouses to verbal cues, appears comfortable. No visitors present. Marie Perkins was cooperative with assessment. Support provided. Medications, goc, poc reviewed. I called Mr Vereen, Marie Perkins son, message left to return call with contact information.    History obtained from review of EMR, discussion with primary team, and interview with family, facility staff/caregiver and/or Marie.  Perkins Perkins.  I reviewed available labs, medications, imaging, studies and related documents from the EMR.  Records reviewed and summarized above.    Physical Exam: General: frail appearing, thin, chronically ill, cognitively  impaired female ENMT: oral mucous membranes moist CV: S1S2, RRR Pulmonary: clear breath sounds, decreased throughout MSK: +muscle wasting Skin: warm and dry Neuro:  + generalized weakness,  + cognitive impairment Psych: sleepy Thank you for the opportunity to participate in the care of Marie Perkins. Please call our office at [phone] if we can be of additional assistance.   Marie Camacho Ihor Gully, NP

## 2022-10-04 ENCOUNTER — Encounter: Payer: Self-pay | Admitting: Nurse Practitioner

## 2022-10-04 ENCOUNTER — Non-Acute Institutional Stay: Payer: Medicare Other | Admitting: Nurse Practitioner

## 2022-10-04 DIAGNOSIS — R627 Adult failure to thrive: Secondary | ICD-10-CM

## 2022-10-04 DIAGNOSIS — R531 Weakness: Secondary | ICD-10-CM

## 2022-10-04 DIAGNOSIS — R5381 Other malaise: Secondary | ICD-10-CM

## 2022-10-04 DIAGNOSIS — Z515 Encounter for palliative care: Secondary | ICD-10-CM

## 2022-10-04 NOTE — Progress Notes (Signed)
[facility] Consult Note Telephone: [phone]  Fax: [phone]    Date of encounter: 10/04/22 3:45 PM PATIENT NAME: Marie Perkins [address] [place] [address]   [phone] (home)  DOB: [birth year] MRN: [id number] PRIMARY CARE PROVIDER:    Alvester Morin, MD,  [facility]  RESPONSIBLE PARTY:    Contact Information     Name Relation Home Work Mobile   Marie Perkins,Marie Perkins Son [place] Daughter [phone]       I met face to face with patient in facility. Palliative Care was asked to follow this patient by consultation request of  [facility], [facility] to address advance care planning and complex medical decision making. This is a follow up visit.                                  ASSESSMENT AND PLAN / RECOMMENDATIONS:  Symptom Management/Plan:  1. Advance Care Planning;  DNR   2. Palliative care encounter; Palliative care encounter; Palliative medicine team will continue to support patient, patient's family, and medical team. Visit consisted of counseling and education dealing with the complex and emotionally intense issues of symptom management and palliative care in the setting of serious and potentially life-threatening illness   3. Adult failure to thrive, generalized weakness, anorexia, debility ongoing, continue to monitor weights, with no current weights. Supportive care, will need further discussions with son about goc, option of hospice services with overall decline, debility, progression of chronic disease in the setting of aging.  10/2021 weight 136 lb 05/21/19 weight 114 lb 07/05/2022 weight 98.7 lb Follow up Palliative Care Visit: Palliative care will continue to follow for complex medical decision making, advance care planning, and clarification of goals. Return 2 to 4 weeks or prn.   I spent 46 minutes providing this consultation. More than 50% of the time in this consultation  was spent in counseling and care coordination. PPS: 30% Chief Complaint: Follow up palliative consult for complex medical decision making, address goals, manage ongoing symptoms   HISTORY OF PRESENT ILLNESS:  Marie Perkins is a 89 y.o. year old female  with multiple medical problems including afib, HTN, rheumatic heart disease, chronic arthritis, hearing loss, h/o hyponatremia, syndrome of inappropriate vasopressin secretion. Marie Perkins resides at [facility], requires assistance for transfers, mobility, adl's bathing, dressing, feeding with declined appetite per staff.  Purpose of today PC f/u visit further discussion monitor trends of appetite, weights, monitor for functional, cognitive decline with chronic disease progression, assess any active symptoms, supportive role. At present Marie Perkins is lying in bed, sleeping, arouses to verbal cues, appears comfortable. No visitors present. Marie Drone NP [facility] student and I visited and observed Marie Perkins Marie Perkins was cooperative with assessment, no meaningful discussion, not answering questions today, fell asleep during pc visit. Support provided. Medications, goc, poc reviewed. I attempted to contact Marie Perkins for update.    History obtained from review of EMR, discussion with primary team, and interview with family, facility staff/caregiver and/or Marie Perkins.  I reviewed available labs, medications, imaging, studies and related documents from the EMR.  Records reviewed and summarized above.    Physical Exam: General: frail appearing, thin, chronically ill, cognitively impaired female ENMT: oral mucous membranes moist CV: S1S2, RRR Pulmonary: clear breath sounds, decreased throughout MSK: +muscle wasting Skin: warm and dry Neuro:  + generalized weakness,  + cognitive  impairment Psych: alert Thank you for the opportunity to participate in the care of Marie Perkins. Please call our office at [phone] if we can be of additional assistance.   Dorothy Landgrebe Ihor Gully, NP

## 2022-10-31 ENCOUNTER — Non-Acute Institutional Stay: Payer: Medicare Other | Admitting: Nurse Practitioner

## 2022-10-31 ENCOUNTER — Encounter: Payer: Self-pay | Admitting: Nurse Practitioner

## 2022-10-31 DIAGNOSIS — R627 Adult failure to thrive: Secondary | ICD-10-CM

## 2022-10-31 DIAGNOSIS — R5381 Other malaise: Secondary | ICD-10-CM

## 2022-10-31 DIAGNOSIS — R531 Weakness: Secondary | ICD-10-CM

## 2022-10-31 DIAGNOSIS — Z515 Encounter for palliative care: Secondary | ICD-10-CM

## 2022-10-31 NOTE — Progress Notes (Signed)
[facility] Consult Note Telephone: [phone]  Fax: [phone]    Date of encounter: 10/31/22 5:01 PM PATIENT NAME: Marie Perkins [address] [place] [address]   [phone] (home)  DOB: 1926/11/04 MRN: [id number] PRIMARY CARE PROVIDER:    [facility]  RESPONSIBLE PARTY:    Contact Information     Name Relation Home Work Mobile   Marie Perkins,Marie Perkins Son [phone]     [facility] Daughter [phone]          I met face to face with patient in facility. Palliative Care was asked to follow this patient by consultation request of  Marie Perkins*/[facility] to address advance care planning and complex medical decision making. This is a follow up visit.                                  ASSESSMENT AND PLAN / RECOMMENDATIONS:  Symptom Management/Plan:  1. Advance Care Planning;  DNR   2. Palliative care encounter; Palliative care encounter; Palliative medicine team will continue to support patient, patient's family, and medical team. Visit consisted of counseling and education dealing with the complex and emotionally intense issues of symptom management and palliative care in the setting of serious and potentially life-threatening illness   3. Adult failure to thrive, generalized weakness, anorexia, debility ongoing, continue to monitor weights, with no current weights. Supportive care, will need further discussions with son about goc, option of hospice services with overall decline, debility, progression of chronic disease in the setting of aging.  10/2021 weight 136 lb 05/21/19 weight 114 lb 07/05/2022 weight 98.7 lb 10/06/2022 weight 100.2 lb gain Follow up Palliative Care Visit: Palliative care will continue to follow for complex medical decision making, advance care planning, and clarification of goals. Return 2 to 4 weeks or prn.   I spent 45 minutes providing this consultation. More than 50% of the time in this  consultation was spent in counseling and care coordination. PPS: 30% Chief Complaint: Follow up palliative consult for complex medical decision making, address goals, manage ongoing symptoms   HISTORY OF PRESENT ILLNESS:  Marie Perkins is a 89 y.o. year old female  with multiple medical problems including afib, HTN, rheumatic heart disease, chronic arthritis, hearing loss, h/o hyponatremia, syndrome of inappropriate vasopressin secretion. Marie Perkins resides at [facility], requires assistance for transfers, mobility, adl's bathing, dressing, feeding with declined appetite per staff.  Purpose of today PC f/u visit further discussion monitor trends of appetite, weights, monitor for functional, cognitive decline with chronic disease progression, assess any active symptoms, supportive role. At present Marie Perkins is lying in bed, sleeping, arouses to verbal cues, appears comfortable. No recent falls, infections, hospitalizations, wounds. No visitors present. I visited and observed Marie Perkins, she was very sleepy did lift her head to verbal cues. No meaningful discussions with cognitive impairment. Marie Perkins was cooperative with assessment. Support provided. Medications, goc, poc reviewed. I attempted to contact Marie Perkins for update.    History obtained from review of EMR, discussion with primary team, and interview with family, facility staff/caregiver and/or Marie Perkins.  I reviewed available labs, medications, imaging, studies and related documents from the EMR.  Records reviewed and summarized above.    Physical Exam: General: frail appearing, thin, chronically ill, cognitively impaired female ENMT: oral mucous membranes moist CV: S1S2, RRR Pulmonary: clear breath sounds, decreased throughout MSK: +muscle wasting Neuro:  +  generalized weakness,  + cognitive impairment Psych: sleepy  Thank you for the opportunity to participate in the care of Marie Perkins. Please call our office at [phone] if we can be of  additional assistance.   Marie Encarnacion Prince [place], NP

## 2022-11-08 ENCOUNTER — Non-Acute Institutional Stay: Payer: Medicare Other | Admitting: Nurse Practitioner

## 2022-11-08 ENCOUNTER — Encounter: Payer: Self-pay | Admitting: Nurse Practitioner

## 2022-11-08 DIAGNOSIS — Z515 Encounter for palliative care: Secondary | ICD-10-CM

## 2022-11-08 DIAGNOSIS — R627 Adult failure to thrive: Secondary | ICD-10-CM

## 2022-11-08 DIAGNOSIS — R531 Weakness: Secondary | ICD-10-CM

## 2022-11-08 DIAGNOSIS — R5381 Other malaise: Secondary | ICD-10-CM

## 2022-11-08 NOTE — Progress Notes (Signed)
[facility] Consult Note Telephone: [phone]  Fax: [phone]    Date of encounter: 11/08/22 8:22 PM PATIENT NAME: TAIYARI CLORAN [address] [place] [address]   [phone] (home)  DOB: Mar 11, 1927 MRN: [id number] PRIMARY CARE PROVIDER:    Keane Police, MD,  [facility]  RESPONSIBLE PARTY:    Contact Information     Name Relation Home Work Mobile   Cammarano,[name] Son [phone]     [facility] Daughter [phone]          I met face to face with patient in facility. Palliative Care was asked to follow this patient by consultation request of  [facility], [facility] to address advance care planning and complex medical decision making. This is a follow up visit.                                  ASSESSMENT AND PLAN / RECOMMENDATIONS:  Symptom Management/Plan:  1. Advance Care Planning;  DNR   2. Palliative care encounter; Palliative care encounter; Palliative medicine team will continue to support patient, patient's family, and medical team. Visit consisted of counseling and education dealing with the complex and emotionally intense issues of symptom management and palliative care in the setting of serious and potentially life-threatening illness   3. Adult failure to thrive, generalized weakness, anorexia, debility ongoing, continue to monitor weights, with no current weights. Supportive care, will need further discussions with son about goc, option of hospice services with overall decline, debility, progression of chronic disease in the setting of aging.  10/2021 weight 136 lb 05/21/19 weight 114 lb 07/05/2022 weight 98.7 lb Follow up Palliative Care Visit: Palliative care will continue to follow for complex medical decision making, advance care planning, and clarification of goals. Return 2 to 4 weeks or prn.   I spent 48 minutes providing this consultation. More than 50% of the time in this  consultation was spent in counseling and care coordination. PPS: 30% Chief Complaint: Follow up palliative consult for complex medical decision making, address goals, manage ongoing symptoms   HISTORY OF PRESENT ILLNESS:  ARALYNN LOSEKE is a 89 y.o. year old female  with multiple medical problems including afib, HTN, rheumatic heart disease, chronic arthritis, hearing loss, h/o hyponatremia, syndrome of inappropriate vasopressin secretion. Ms. Yanagi resides at [facility], requires assistance for transfers, mobility, adl's bathing, dressing, feeding with declined appetite per staff.  Purpose of today PC f/u visit further discussion monitor trends of appetite, weights, monitor for functional, cognitive decline with chronic disease progression, assess any active symptoms, supportive role. At present Ms. Reiley is lying in bed, sleeping, arouses to verbal cues, appears comfortable.   Support provided. Medications, goc, poc reviewed. I attempted to contact Aneta Mins for update.    History obtained from review of EMR, discussion with primary team, and interview with family, facility staff/caregiver and/or Ms. Keithley.  I reviewed available labs, medications, imaging, studies and related documents from the EMR.  Records reviewed and summarized above.    Physical Exam: General: frail appearing, thin, chronically ill, cognitively impaired female ENMT: oral mucous membranes moist CV: S1S2, RRR Pulmonary: clear breath sounds, decreased throughout MSK: +muscle wasting Skin: warm and dry Neuro:  + generalized weakness,  + cognitive impairment Psych: alert Thank you for the opportunity to participate in the care of Ms. Michael. Please call our office at [phone] if we can be of additional  assistance.   Militza Devery Prince Rome, NP

## 2022-12-01 ENCOUNTER — Non-Acute Institutional Stay: Payer: Medicare Other | Admitting: Nurse Practitioner

## 2022-12-01 ENCOUNTER — Encounter: Payer: Self-pay | Admitting: Nurse Practitioner

## 2022-12-01 DIAGNOSIS — R531 Weakness: Secondary | ICD-10-CM

## 2022-12-01 DIAGNOSIS — Z515 Encounter for palliative care: Secondary | ICD-10-CM

## 2022-12-01 DIAGNOSIS — E43 Unspecified severe protein-calorie malnutrition: Secondary | ICD-10-CM

## 2022-12-01 DIAGNOSIS — R627 Adult failure to thrive: Secondary | ICD-10-CM

## 2022-12-01 DIAGNOSIS — R5381 Other malaise: Secondary | ICD-10-CM

## 2022-12-01 DIAGNOSIS — R634 Abnormal weight loss: Secondary | ICD-10-CM

## 2022-12-01 NOTE — Progress Notes (Signed)
Therapist, nutritional Palliative Care Consult Note Telephone: [phone]  Fax: [phone]    Date of encounter: 12/01/22 2:52 PM PATIENT NAME: Marie Perkins [address] [place] [address]   [phone] (home)  DOB: [birth year] MRN: [id number] PRIMARY CARE PROVIDER:    Keane Police, MD,  [facility]  RESPONSIBLE PARTY:    Contact Information     Name Relation Home Work Mobile   Paulo,[name] Son [phone]     [facility] Daughter [phone]          I met face to face with patient in facility. Palliative Care was asked to follow this patient by consultation request of  [facility], [facility]*/[facility] to address advance care planning and complex medical decision making. This is a follow up visit.                                  ASSESSMENT AND PLAN / RECOMMENDATIONS:  Symptom Management/Plan:  1. Advance Care Planning;  DNR   2. Palliative care encounter; Palliative care encounter; Palliative medicine team will continue to support patient, patient's family, and medical team. Visit consisted of counseling and education dealing with the complex and emotionally intense issues of symptom management and palliative care in the setting of serious and potentially life-threatening illness   3. Adult failure to thrive, generalized weakness, anorexia, debility ongoing, continue to monitor weights, with no current weights. Supportive care, will need further discussions with son about goc, option of hospice services with overall decline, debility, progression of chronic disease in the setting of aging.  10/2021 weight 136 lb 05/21/19 weight 114 lb 07/05/2022 weight 98.7 lb 11/17/2022 weight 96.2 lbs 39.8 lbs/12 months 17.8 lbs/6 months 2.5 lbs/4.5 months Follow up Palliative Care Visit: Palliative care will continue to follow for complex medical decision making, advance care planning, and clarification of goals. Return 2 to 4 weeks or prn.   I  spent 45 minutes providing this consultation. More than 50% of the time in this consultation was spent in counseling and care coordination. PPS: 30% Chief Complaint: Follow up palliative consult for complex medical decision making, address goals, manage ongoing symptoms   HISTORY OF PRESENT ILLNESS:  Marie Perkins is a 89 y.o. year old female  with multiple medical problems including afib, HTN, rheumatic heart disease, chronic arthritis, hearing loss, h/o hyponatremia, syndrome of inappropriate vasopressin secretion. Marie Perkins resides at [facility], requires assistance for transfers, mobility, adl's bathing, dressing, feeding with declined appetite per staff.  Purpose of today PC f/u visit further discussion monitor trends of appetite, weights, monitor for functional, cognitive decline with chronic disease progression, assess any active symptoms, supportive role. At present Marie Perkins is lying in bed, sleeping, arouses to verbal cues, appears comfortable, daughter at bedside. Daughter assisting with trying to give Ms Wedlake fluids. We talked about how Ms Perkins has been doing, no further changes, continues with poor appetite, sleeping a lot. We talked about goc and ongoing discussions of progression to end of life, hospice though family continues to decline. We talked about role pc in poc. Ms Perkins did awake then return back to sleep during pc visit, cooperative. Support provided. Medications, goc, poc reviewed. PC f/u visit close monitoring with ongoing declien, further discussion monitor trends of appetite, weights, monitor for functional, cognitive decline with chronic disease progression, assess any active symptoms, supportive role.   History obtained from review of EMR, discussion  with primary team, and interview with family, facility staff/caregiver and/or Marie Perkins.  I reviewed available labs, medications, imaging, studies and related documents from the EMR.  Records reviewed and summarized above.     Physical Exam: General: frail appearing, thin, chronically ill, cognitively impaired female, lethargic ENMT: oral mucous membranes moist CV: S1S2, RRR Pulmonary: clear breath sounds, decreased throughout MSK: +muscle wasting/atrophy Neuro:  + generalized weakness,  + cognitive impairment Psych: lethargic  Thank you for the opportunity to participate in the care of Marie Perkins. Please call our office at [phone] if we can be of additional assistance.   Marie Rohe Prince Rome, NP

## 2022-12-16 ENCOUNTER — Non-Acute Institutional Stay: Payer: Medicare Other | Admitting: Nurse Practitioner

## 2022-12-16 DIAGNOSIS — R531 Weakness: Secondary | ICD-10-CM

## 2022-12-16 DIAGNOSIS — R5381 Other malaise: Secondary | ICD-10-CM

## 2022-12-16 DIAGNOSIS — Z515 Encounter for palliative care: Secondary | ICD-10-CM

## 2022-12-16 DIAGNOSIS — R627 Adult failure to thrive: Secondary | ICD-10-CM

## 2022-12-16 DIAGNOSIS — E43 Unspecified severe protein-calorie malnutrition: Secondary | ICD-10-CM

## 2022-12-16 DIAGNOSIS — R634 Abnormal weight loss: Secondary | ICD-10-CM

## 2022-12-16 NOTE — Progress Notes (Signed)
Therapist, nutritional Palliative Care Consult Note Telephone: [phone]  Fax: [phone]    Date of encounter: 12/16/22 4:15 PM PATIENT NAME: Marie Perkins [address] [place] [address]   [phone] (home)  DOB: 09/30/1926 MRN: [id number] PRIMARY CARE PROVIDER:    Keane Police, MD,  [phone] Marie [place] [address] [phone]  REFERRING PROVIDER:   Keane Police, MD [phone] [place],  [place] [address] [phone] RESPONSIBLE PARTY:    Contact Information       Name Relation Home Work Mobile    Perkins,Marie Son [phone]        [facility] Daughter [phone]                I met face to face with patient in facility. Palliative Care was asked to follow this patient by consultation request of  Marie Perkins, Marie Perkins*/[facility] to address advance care planning and complex medical decision making. This is a follow up visit.                                  ASSESSMENT AND PLAN / RECOMMENDATIONS:  Symptom Management/Plan:  1. Advance Care Planning;  DNR   2. Palliative care encounter; Palliative care encounter; Palliative medicine team will continue to support patient, patient's family, and medical team. Visit consisted of counseling and education dealing with the complex and emotionally intense issues of symptom management and palliative care in the setting of serious and potentially life-threatening illness   3. Adult failure to thrive, generalized weakness, anorexia, debility ongoing, continue to monitor weights, with no current weights. Supportive care, will need further discussions with son about goc, option of hospice services with overall decline, debility, progression of chronic disease in the setting of aging.  10/2021 weight 136 lb 05/21/19 weight 114 lb 07/05/2022 weight 98.7 lb 11/17/2022 weight 96.2 lbs 39.8 lbs/12 months 17.8 lbs/6 months 2.5 lbs/4.5 months Follow up Palliative  Care Visit: Palliative care will continue to follow for complex medical decision making, advance care planning, and clarification of goals. Return 2 to 4 weeks or prn.   I spent 47 minutes providing this consultation. More than 50% of the time in this consultation was spent in counseling and care coordination. PPS: 30% Chief Complaint: Follow up palliative consult for complex medical decision making, address goals, manage ongoing symptoms   HISTORY OF PRESENT ILLNESS:  Marie Perkins is a 89 y.o. year old female  with multiple medical problems including afib, HTN, rheumatic heart disease, chronic arthritis, hearing loss, h/o hyponatremia, syndrome of inappropriate vasopressin secretion. Marie Perkins resides at [facility], requires assistance for transfers, mobility, adl's bathing, dressing, feeding with declined appetite per staff.  Purpose of today PC f/u visit further discussion monitor trends of appetite, weights, monitor for functional, cognitive decline with chronic disease progression, assess any active symptoms, supportive role. At present Marie Perkins is lying in bed, just returned from being bathed. She appears fragile, elderly, severely debilitated, cachetic. Marie Perkins was awake, kept her eyes open through pc visit. She was non-verbal for visit, pointing, cooperative. Support provided. Medications, goc, poc reviewed. PC f/u visit close monitoring with ongoing declien, further discussion monitor trends of appetite, weights, monitor for functional, cognitive decline with chronic disease progression, assess any active symptoms, supportive role.   History obtained from review of EMR, discussion with primary team, and interview with family, facility staff/caregiver and/or Marie Perkins.  I reviewed available labs, medications, imaging, studies and related documents from the EMR.  Records reviewed and summarized above.    Physical Exam: General: frail appearing, thin, chronically ill, cognitively impaired  female, lethargic ENMT: oral mucous membranes moist CV: S1S2, RRR Pulmonary: clear breath sounds, decreased throughout MSK: +muscle wasting/atrophy Neuro:  + generalized weakness,  + cognitive impairment Psych: more alert today  Thank you for the opportunity to participate in the care of Marie Perkins. Please call our office at [phone] if we can be of additional assistance.   Marie Brubacher Prince Rome, NP

## 2023-07-05 DEATH — deceased

## 2023-10-21 IMAGING — CR DG CHEST 2V
1 series · 2 of 2 positions shown · non-contrast
Comparison: None.

CLINICAL DATA: Shortness of breath

EXAM:
CHEST - 2 VIEW

[Series 1: dg chest 2 view · 0.14mm/px · 2 of 2 slices shown]
[im 1/2]
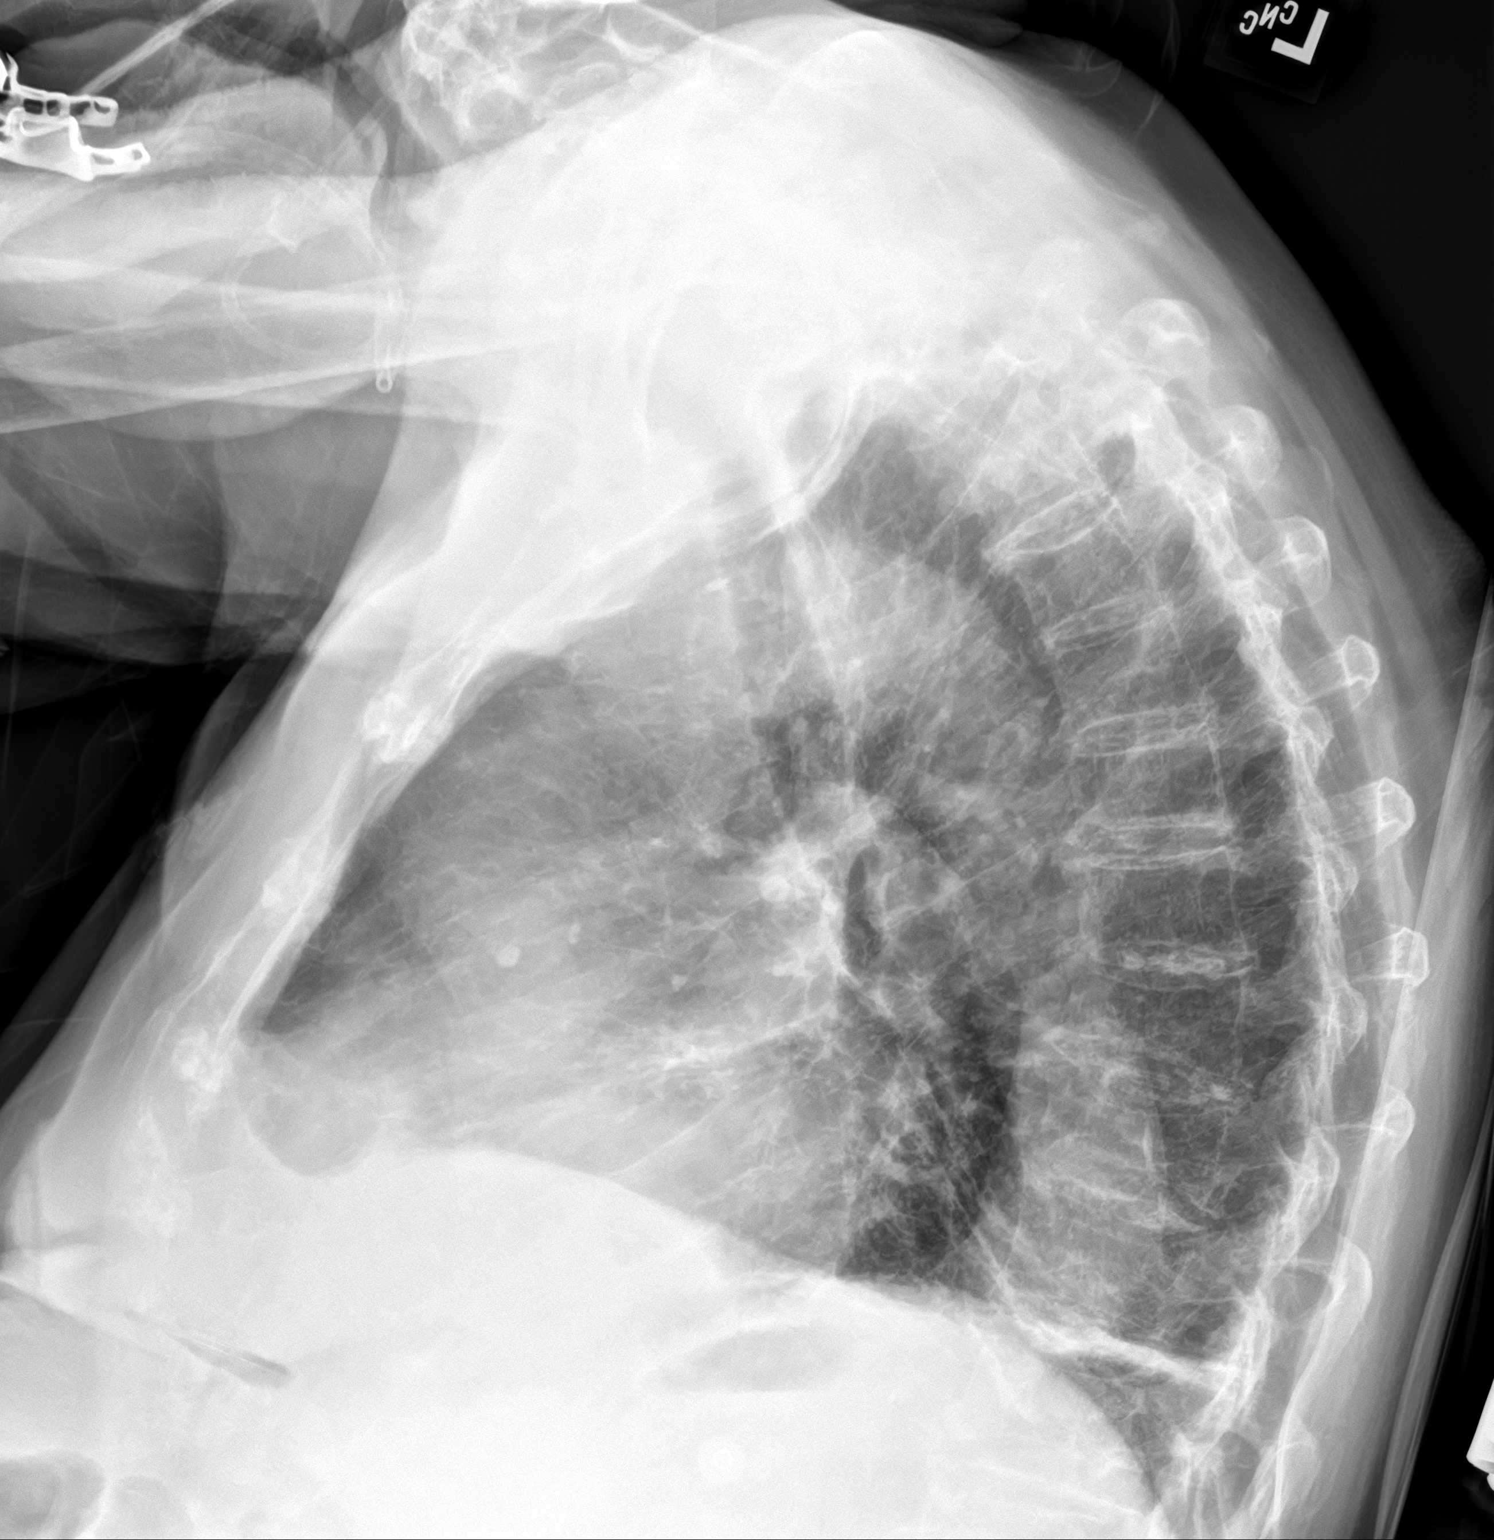
[im 2/2]
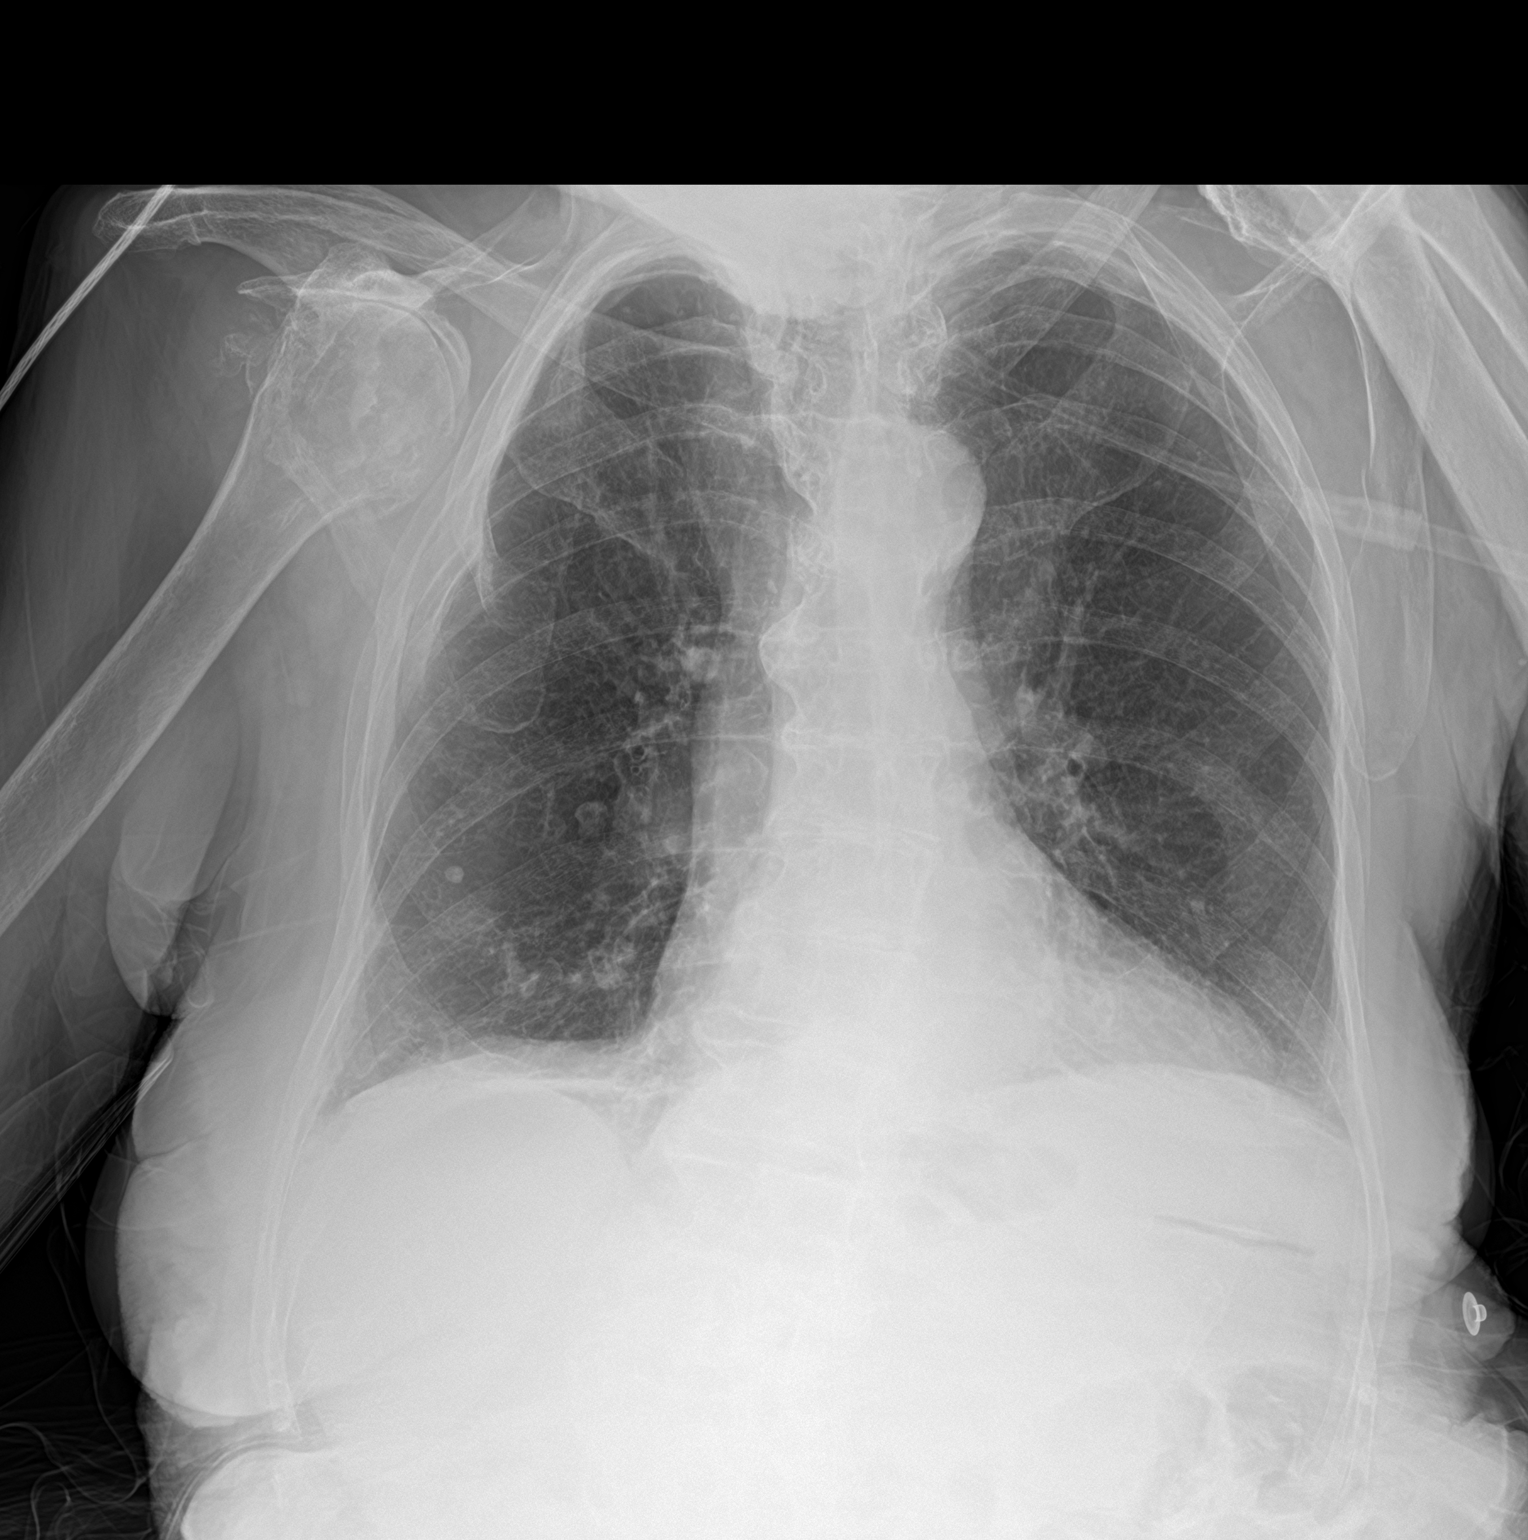

[2 of 2 positions shown; findings below may reference images not displayed]

FINDINGS: The heart is mildly enlarged. The upper mediastinal contours are
normal.

Lungs hyperinflated suggesting underlying COPD. Linear opacities in
the lung bases likely reflect subsegmental atelectasis. There is a
probable calcified granuloma projecting over the right lower lobe.
Otherwise, there is no focal consolidation or pulmonary edema. There
is no pleural effusion or pneumothorax.

There is mild compression deformity of 2 midthoracic vertebral
bodies of indeterminate chronicity. There is marked degenerative
change of the shoulders.
IMPRESSION: 1. Mild cardiomegaly.
2. COPD.
3. Probable bibasilar subsegmental atelectasis. Otherwise, no focal
consolidation or pleural effusion.
4. Age-indeterminate compression deformity of two midthoracic
vertebral bodies.

## 2023-11-26 IMAGING — CT CT CHEST W/O CM
2 of 4 series · 15 of 36 positions shown, 18 images · non-contrast
Comparison: Chest x-ray 09/04/2021

CLINICAL DATA: Left lung mass.



[Series 2: thorax · axial · 0.71mm/px · z∈[-550,-336]mm · 12 of 127 slices shown, 15 images]
[im 10/127  mediastinal]
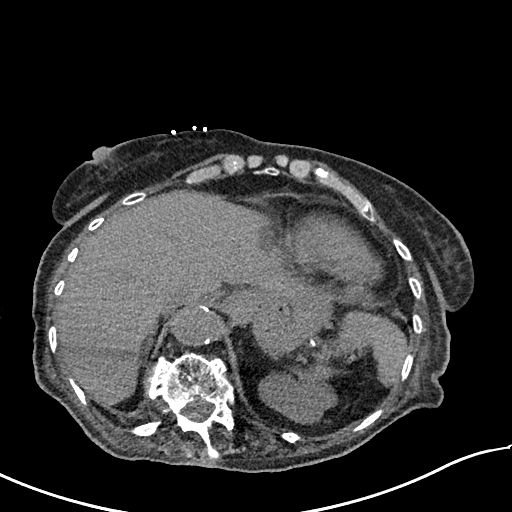
[im 10/127  lung]
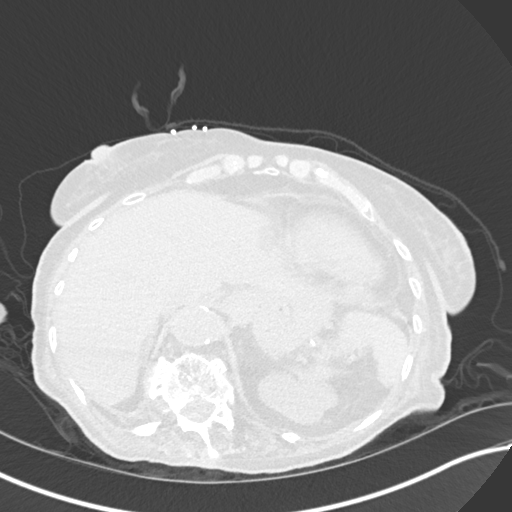
[im 20/127  lung]
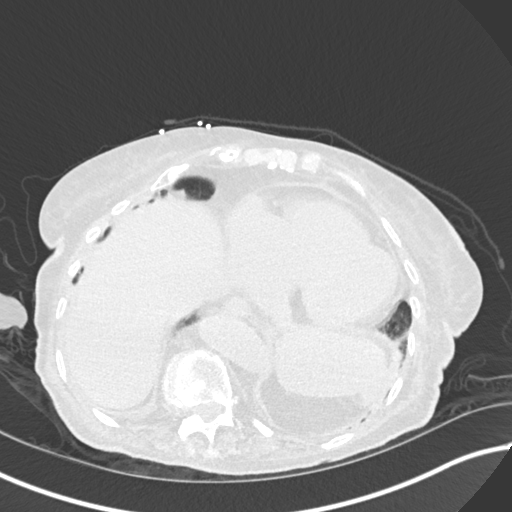
[im 30/127  lung]
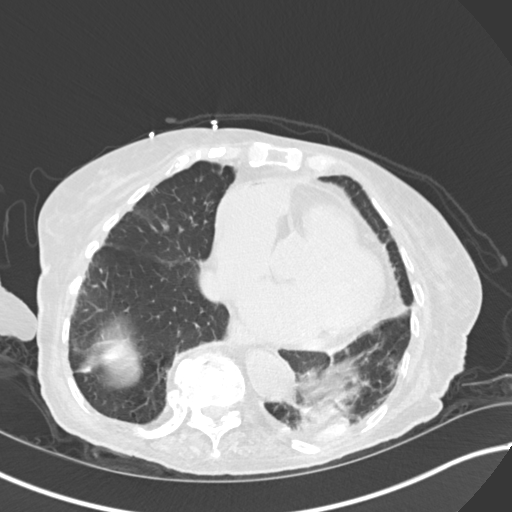
[im 39/127  lung]
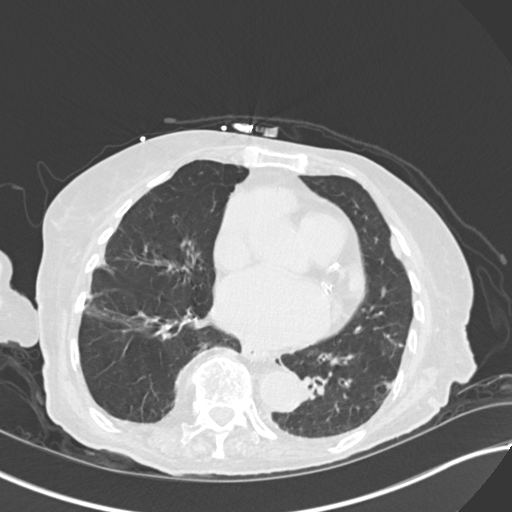
[im 49/127  mediastinal]
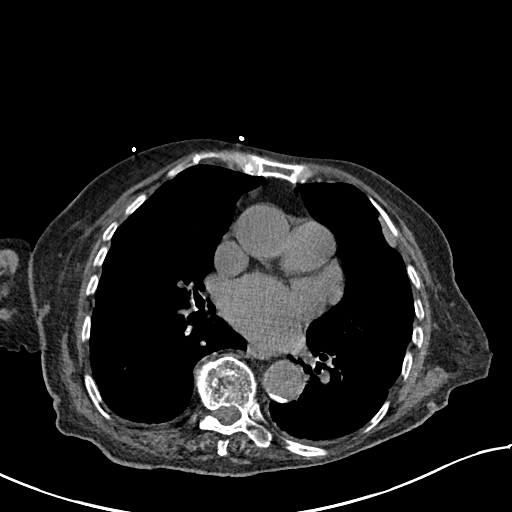
[im 49/127  lung]
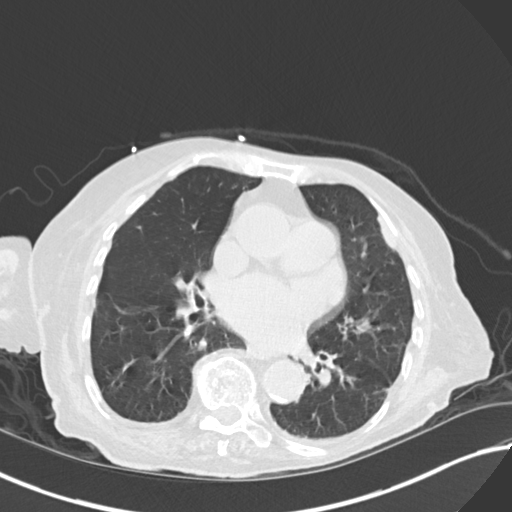
[im 59/127  lung]
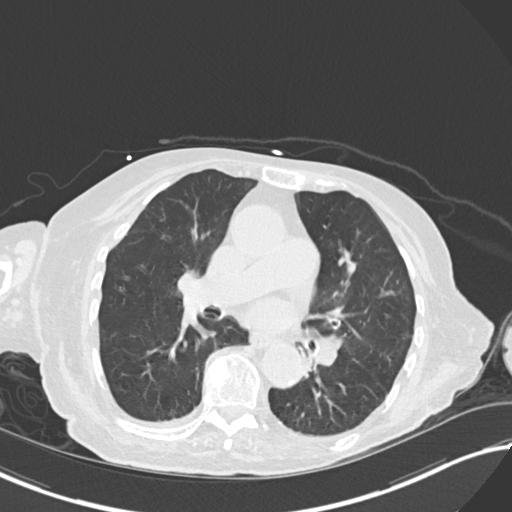
[im 68/127  lung]
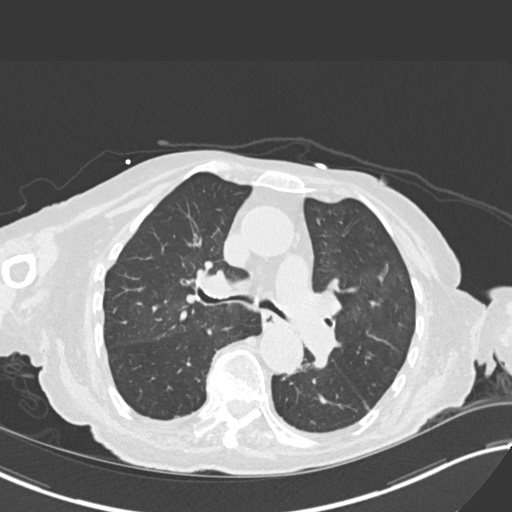
[im 78/127  lung]
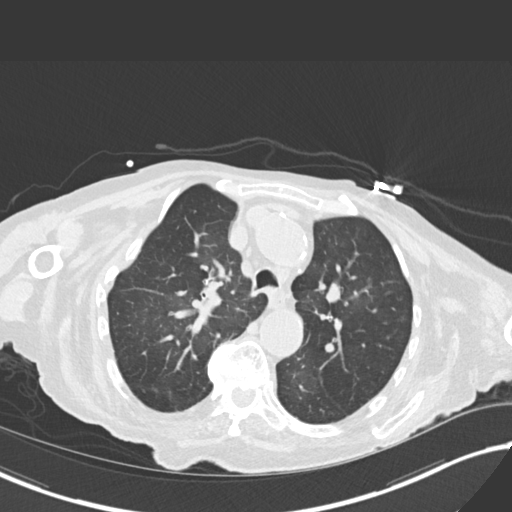
[im 88/127  mediastinal]
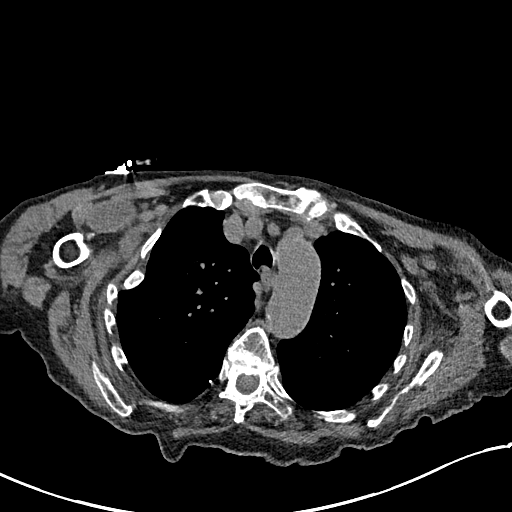
[im 88/127  lung]
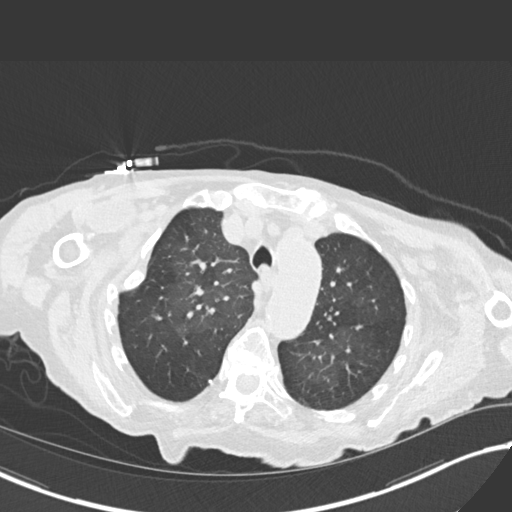
[im 97/127  lung]
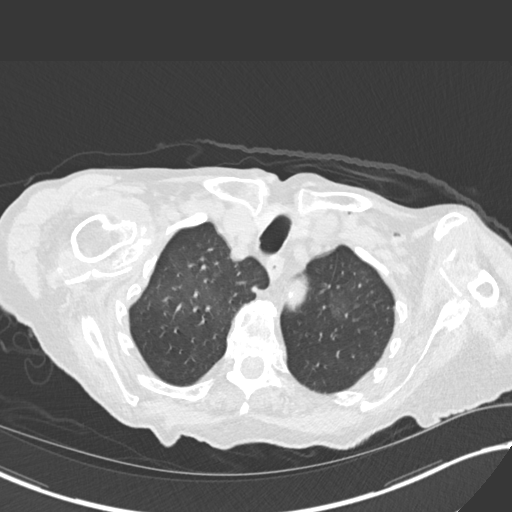
[im 107/127  lung]
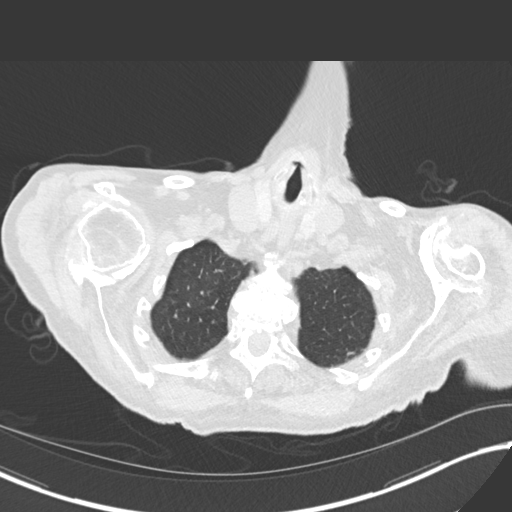
[im 117/127  lung]
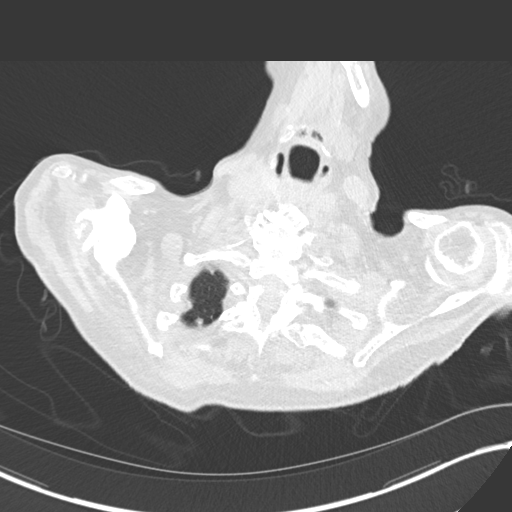

[Series 5: coronal · coronal · 0.66mm/px · 3 of 123 slices shown]
[im 25/123  lung]
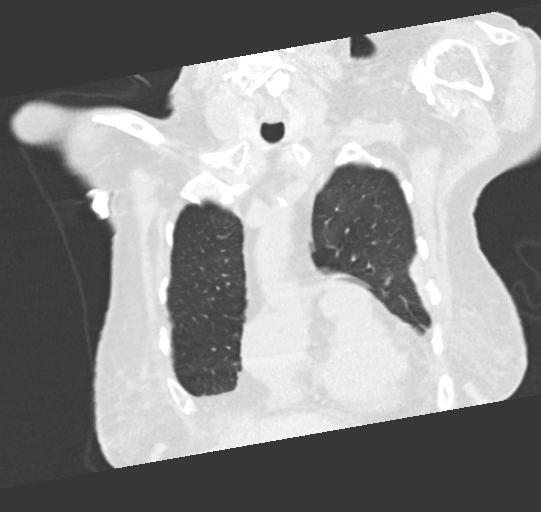
[im 49/123  lung]
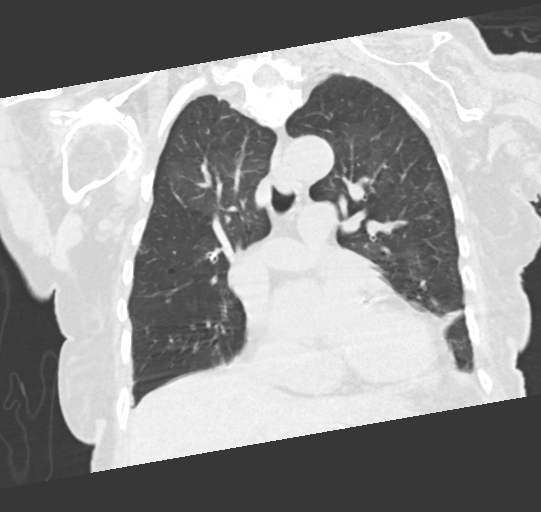
[im 74/123  lung]
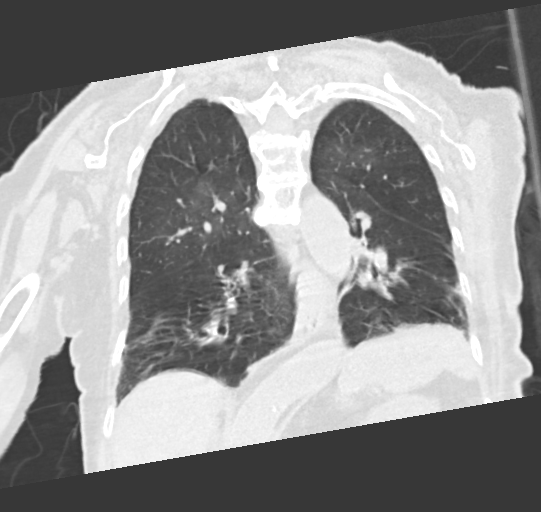

[15 of 36 positions shown; findings below may reference images not displayed]

FINDINGS: Cardiovascular: No significant vascular findings. Mild cardiomegaly.
No pericardial effusion. Thoracic aortic atherosclerosis. Coronary
artery atherosclerosis.

Mediastinum/Nodes: No enlarged mediastinal or axillary lymph nodes.
Thyroid gland, trachea, and esophagus demonstrate no significant
findings.

Lungs/Pleura: Geographic areas ground-glass in the upper lobes
bilaterally may be secondary to relative air trapping during
expiration. Right middle lobe and bilateral lower lobe
bronchiectasis with mild chronic interstitial thickening. Calcified
4 mm right middle lobe pulmonary nodule likely reflecting sequela
prior granulomatous disease. Left basilar airspace disease likely
reflecting atelectasis versus pneumonia.

Upper Abdomen: 15 mm hypodense, fluid attenuating left hepatic mass
most consistent with a cyst. Small hiatal hernia.

Musculoskeletal: Severe osteoarthritis of bilateral glenohumeral
joints. Levoscoliosis of the thoracic spine. Degenerative disease
with disc height loss and facet arthropathy throughout the
cervicothoracic spine.
IMPRESSION: 1. Left basilar airspace disease likely reflecting atelectasis
versus pneumonia.
2. Geographic areas of ground-glass in the upper lobes bilaterally,
nonspecific. Findings could be secondary to chronic interstitial
lung disease.
3. Right middle lobe and bilateral lower lobe bronchiectasis with
mild chronic interstitial thickening.
4. Aortic Atherosclerosis ([id number].L).
